# Patient Record
Sex: Male | Born: 1985
Health system: Southern US, Community
[De-identification: ages and names within clinical notes are randomized; demographics above are authoritative.]

## PROBLEM LIST (undated history)

## (undated) DIAGNOSIS — I10 Essential (primary) hypertension: Secondary | ICD-10-CM

## (undated) DIAGNOSIS — E669 Obesity, unspecified: Secondary | ICD-10-CM

## (undated) DIAGNOSIS — D239 Other benign neoplasm of skin, unspecified: Secondary | ICD-10-CM

## (undated) DIAGNOSIS — J309 Allergic rhinitis, unspecified: Secondary | ICD-10-CM

## (undated) DIAGNOSIS — K219 Gastro-esophageal reflux disease without esophagitis: Secondary | ICD-10-CM

## (undated) DIAGNOSIS — R51 Headache: Secondary | ICD-10-CM

## (undated) DIAGNOSIS — Z800 Family history of malignant neoplasm of digestive organs: Secondary | ICD-10-CM

## (undated) DIAGNOSIS — K589 Irritable bowel syndrome without diarrhea: Secondary | ICD-10-CM

## (undated) DIAGNOSIS — R030 Elevated blood-pressure reading, without diagnosis of hypertension: Secondary | ICD-10-CM

## (undated) DIAGNOSIS — F411 Generalized anxiety disorder: Secondary | ICD-10-CM

## (undated) DIAGNOSIS — E66811 Obesity, class 1: Secondary | ICD-10-CM

## (undated) DIAGNOSIS — R519 Headache, unspecified: Secondary | ICD-10-CM

## (undated) HISTORY — PX: OTHER SURGICAL HISTORY: SHX169

## (undated) HISTORY — DX: Elevated blood-pressure reading, without diagnosis of hypertension: R03.0

## (undated) HISTORY — DX: Other benign neoplasm of skin, unspecified: D23.9

## (undated) HISTORY — DX: Family history of malignant neoplasm of digestive organs: Z80.0

## (undated) HISTORY — DX: Gastro-esophageal reflux disease without esophagitis: K21.9

## (undated) HISTORY — DX: Headache: R51

## (undated) HISTORY — DX: Generalized anxiety disorder: F41.1

## (undated) HISTORY — DX: Obesity, class 1: E66.811

## (undated) HISTORY — DX: Headache, unspecified: R51.9

## (undated) HISTORY — DX: Essential (primary) hypertension: I10

## (undated) HISTORY — DX: Allergic rhinitis, unspecified: J30.9

## (undated) HISTORY — PX: CLAVICLE SURGERY: SHX598

## (undated) HISTORY — DX: Irritable bowel syndrome, unspecified: K58.9

## (undated) HISTORY — DX: Obesity, unspecified: E66.9

---

## 2015-09-30 HISTORY — PX: HAIR TRANSPLANT: SHX1719

## 2015-12-05 ENCOUNTER — Ambulatory Visit (INDEPENDENT_AMBULATORY_CARE_PROVIDER_SITE_OTHER): Payer: Self-pay | Admitting: Family Medicine

## 2015-12-05 ENCOUNTER — Encounter: Payer: Self-pay | Admitting: Family Medicine

## 2015-12-05 VITALS — BP 129/86 | HR 67 | Temp 97.7°F | Resp 16 | Ht 69.0 in | Wt 223.5 lb

## 2015-12-05 DIAGNOSIS — F411 Generalized anxiety disorder: Secondary | ICD-10-CM

## 2015-12-05 DIAGNOSIS — F410 Panic disorder [episodic paroxysmal anxiety] without agoraphobia: Secondary | ICD-10-CM

## 2015-12-05 DIAGNOSIS — R519 Headache, unspecified: Secondary | ICD-10-CM

## 2015-12-05 DIAGNOSIS — R51 Headache: Secondary | ICD-10-CM

## 2015-12-05 MED ORDER — DULOXETINE HCL 30 MG PO CPEP: 30.0000 mg | ORAL_CAPSULE | Freq: Every day | ORAL | Status: DC

## 2015-12-05 NOTE — Progress Notes (Signed)
Pre visit review using our clinic review tool, if applicable. No additional management support is needed unless otherwise documented below in the visit note. 

## 2015-12-05 NOTE — Progress Notes (Signed)
Office Note 12/05/2015  CC:  Chief Complaint  Patient presents with  . Establish Care  . Headache    off and on x 1 year    HPI:  Alec Downs is a 30 y.o. White male who is here to establish care. Patient's most recent primary MD: last one was in [place] 3 yrs ago. Old records were not reviewed prior to or during today's visit.  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  Typical HA: he feels back of neck get very tight and the pain extends up into the occipital area to top of head, throbbing.  Last hours usually.  Not sleeping well b/c he worries about everything and his mind won't shut down.  These HA's occur almost daily.  This is more frequent than when they first started.  Mild feeling of disequilibrium at onset but no vertigo or presyncope/syncope.  No nausea.  +Photophobia. Triggers: talking about finances for his company, payroll issues--work stress in general. Gets 4-5 hours of sleep at night b/c brain not shutting down.    He has always been high strung and a chronic worrier, was on adderall in college.  Switched to vyvanse by another MD and had to stop b/c poor appetite and didn't like the way it made him feel.   Mood: some periods of up and down depending on how his company is doing but no prolonged periods of sad/depressed mood.   Has hx of panic attacks: example; was driving and thinking about what was said in a business meeting and started to feel closed in, get sweaty, breath fast and deep, began to feel light headed.   Has happened twice in last 3-4 mo. His last vacation was 10/2015.  His headaches occurred much less frequently during that week. Takes 2-3 ibuprofen when he gets HA--this helps some. He exercises 45 min 3 days a week.  Past Medical History  Diagnosis Date  . Frequent headaches   . GERD (gastroesophageal reflux disease)   . Elevated blood pressure reading  without diagnosis of hypertension     Past Surgical History  Procedure Laterality Date  . Hair transplant  09/2015    [facility]    Family History  Problem Relation Age of Onset  . Hypertension Mother   . Arthritis Father   . Stroke Maternal Grandmother   . Diabetes Maternal Grandmother   . Stroke Maternal Grandfather   . Stroke Paternal Grandmother   . Aneurysm Paternal Grandmother   . Stroke Paternal Grandfather   . Pancreatic cancer Maternal Uncle     Social History   Social History  . Marital Status: Married    Spouse Name: N/A  . Number of Children: N/A  . Years of Education: N/A   Occupational History  . Not on file.   Social History Main Topics  . Smoking status: Never Smoker   . Smokeless tobacco: Never Used  . Alcohol Use: No  . Drug Use: No  . Sexual Activity: Not on file   Other Topics Concern  . Not on file   Social History Narrative   Married, 1 daughter.   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   No T/A/Ds.    Outpatient Encounter Prescriptions as of 12/05/2015  Medication Sig  . cetirizine (ZYRTEC) 10 MG tablet Take 10 mg by mouth daily.  . DULoxetine (CYMBALTA) 30 MG capsule Take  1 capsule (30 mg total) by mouth daily.  Marland Kitchen esomeprazole (NEXIUM) 20 MG capsule Take 20 mg by mouth daily at 12 noon.   No facility-administered encounter medications on file as of 12/05/2015.    No Known Allergies  ROS Review of Systems  Constitutional: Negative for fever and fatigue.  HENT: Negative for congestion and sore throat.   Eyes: Negative for visual disturbance.  Respiratory: Negative for cough.   Cardiovascular: Negative for chest pain.  Gastrointestinal: Negative for nausea and abdominal pain.  Genitourinary: Negative for dysuria.  Musculoskeletal: Negative for back pain and joint swelling.  Skin: Negative for rash.  Neurological: Positive for headaches. Negative for dizziness, tremors and weakness.  Hematological: Negative for  adenopathy.  Psychiatric/Behavioral: Positive for sleep disturbance and decreased concentration. Negative for dysphoric mood. The patient is nervous/anxious.     PE; Blood pressure 129/86, pulse 67, temperature 97.7 F (36.5 C), temperature source Oral, resp. rate 16, height 5\' 9"  (1.753 m), weight 223 lb 8 oz (101.379 kg), SpO2 96 %. Body mass index is 32.99 kg/(m^2).  Gen: Alert, well appearing.  Patient is oriented to person, place, time, and situation. AFFECT: pleasant, lucid thought and speech.  Anxious and very talkative. [id number]: no injection, icteris, swelling, or exudate.  EOMI, PERRLA. Mouth: lips without lesion/swelling.  Oral mucosa pink and moist. Oropharynx without erythema, exudate, or swelling.  Neck - No masses or thyromegaly or limitation in range of motion.  Back of neck soft tissues mildly tight but nontender. CV: RRR, no m/r/g.   LUNGS: CTA bilat, nonlabored resps, good aeration in all lung fields. ABD: soft, NT/ND EXT: no clubbing, cyanosis, or edema.  Neuro: CN 2-12 intact bilaterally, strength 5/5 in proximal and distal upper extremities and lower extremities bilaterally.  No sensory deficits.  No tremor.  No disdiadochokinesis.  No ataxia.  Upper extremity and lower extremity DTRs symmetric.  No pronator drift.  Pertinent labs:  None today.  ASSESSMENT AND PLAN:   New pt; no old records to obtain.  1) Recurrent occipital HA's; these sound like they may have some migrainous features. These are definitely anxiety/stress related. Plan discussed, and at this time he'll continue to treat HA's with prn ibuprofen and we'll work on treating the underlying cause.  2) GAD with panic: he is definitely very high strung. Decided on trial of duloxetine 30mg  qd.  Therapeutic expectations and side effect profile of medication discussed today.  Patient's questions answered. Continue with good exercise habits. We definitely want to try to institute one change/treatment at a  time with him. In the future, he may indeed still need an abortive med like imitrex and a migraine preventative med. We'll just have to wait and see how things go as we get his anxiety controlled.  An After Visit Summary was printed and given to the patient.  Return in about 4 weeks (around 01/02/2016) for f/u anxiety and HAs (30 min).  Signed:  Crissie Sickles, MD           12/05/2015

## 2015-12-16 ENCOUNTER — Ambulatory Visit: Payer: Self-pay | Admitting: Family Medicine

## 2016-01-03 ENCOUNTER — Ambulatory Visit: Payer: Managed Care, Other (non HMO) | Admitting: Family Medicine

## 2016-01-07 ENCOUNTER — Ambulatory Visit: Payer: Managed Care, Other (non HMO) | Admitting: Family Medicine

## 2016-01-15 ENCOUNTER — Ambulatory Visit (INDEPENDENT_AMBULATORY_CARE_PROVIDER_SITE_OTHER): Payer: Managed Care, Other (non HMO) | Admitting: Family Medicine

## 2016-01-15 ENCOUNTER — Encounter: Payer: Self-pay | Admitting: Family Medicine

## 2016-01-15 VITALS — BP 131/83 | HR 87 | Temp 98.7°F | Resp 16 | Ht 69.0 in | Wt 225.2 lb

## 2016-01-15 DIAGNOSIS — F411 Generalized anxiety disorder: Secondary | ICD-10-CM

## 2016-01-15 DIAGNOSIS — R51 Headache: Secondary | ICD-10-CM

## 2016-01-15 DIAGNOSIS — G4700 Insomnia, unspecified: Secondary | ICD-10-CM

## 2016-01-15 DIAGNOSIS — R519 Headache, unspecified: Secondary | ICD-10-CM

## 2016-01-15 NOTE — Progress Notes (Signed)
Pre visit review using our clinic review tool, if applicable. No additional management support is needed unless otherwise documented below in the visit note. 

## 2016-01-15 NOTE — Progress Notes (Signed)
OFFICE VISIT  01/15/2016   CC:  Chief Complaint  Patient presents with  . Follow-up    Anxiety and HA's     HPI:    Patient is a 30 y.o. Caucasian male who presents for 5 week f/u frequent HA's and anxiety. Started duloxetine 30mg  qd last visit. Feels like it is helping his anxiety/stress.  He started seeing a therapist a week or two ago and is happy with this. Denies any headaches since he started taking the duloxetine.  He is happy with results and wants to stick with current dosing. Sleep is still a problem.  Normally initiates sleep well but wakes up around 1:30 AM many nights and can't go back to sleep for several hours.  Has tried melatonin: helps but has a hangover effect per pt report.  He feels like he'll figure his sleep problem out as he gets better at "de-stressing".  A brief ambien trial helped in the past but then when he stopped it he felt "weird" for a week afterwards. He doesn't want to try a new med at this time.  Past Medical History:  Diagnosis Date  . Elevated blood pressure reading without diagnosis of hypertension   . Frequent headaches    secondary to chronic anxiety---CT scan brain normal in the past per pt report.  Marland Kitchen GERD (gastroesophageal reflux disease)   . Obesity, Class I, BMI 30-34.9     Past Surgical History:  Procedure Laterality Date  . HAIR TRANSPLANT  09/2015   [facility]    Outpatient Medications Prior to Visit  Medication Sig Dispense Refill  . cetirizine (ZYRTEC) 10 MG tablet Take 10 mg by mouth daily.    . DULoxetine (CYMBALTA) 30 MG capsule Take 1 capsule (30 mg total) by mouth daily. 30 capsule 1  . esomeprazole (NEXIUM) 20 MG capsule Take 20 mg by mouth daily at 12 noon.     No facility-administered medications prior to visit.     No Known Allergies  ROS As per HPI  PE: Blood pressure 131/83, pulse 87, temperature 98.7 F (37.1 C), temperature source Oral, resp. rate 16, height 5\' 9"  (1.753 m), weight 225 lb 4 oz (102.2 kg),  SpO2 94 %. Gen: Alert, well appearing.  Patient is oriented to person, place, time, and situation. AFFECT: pleasant, lucid thought and speech. No further exam today.  LABS:  none  IMPRESSION AND PLAN:  1) GAD, with secondary stress/tension HA's: pt has responded well to 30mg  qd dosing of duloxetine. He is also benefiting from counseling.  Continue all current measures.  2) Insomnia, secondary to anxiety.  He'll continue to try sleep hygiene/behavior mod. No med trial at this time.  An After Visit Summary was printed and given to the patient.  FOLLOW UP: Return in about 3 months (around 04/16/2016) for f/u anxiety/headaches.  Signed:  Crissie Sickles, MD           01/15/2016

## 2016-01-31 ENCOUNTER — Other Ambulatory Visit: Payer: Self-pay

## 2016-01-31 DIAGNOSIS — F32A Depression, unspecified: Secondary | ICD-10-CM

## 2016-01-31 DIAGNOSIS — F329 Major depressive disorder, single episode, unspecified: Secondary | ICD-10-CM

## 2016-01-31 MED ORDER — DULOXETINE HCL 30 MG PO CPEP: 30.0000 mg | ORAL_CAPSULE | Freq: Every day | ORAL | 4 refills | Status: DC

## 2016-04-06 ENCOUNTER — Other Ambulatory Visit: Payer: Self-pay

## 2016-04-06 MED ORDER — DULOXETINE HCL 30 MG PO CPEP: 30.0000 mg | ORAL_CAPSULE | Freq: Every day | ORAL | 2 refills | Status: DC

## 2016-07-08 ENCOUNTER — Encounter: Payer: Self-pay | Admitting: Family Medicine

## 2016-07-08 ENCOUNTER — Ambulatory Visit (INDEPENDENT_AMBULATORY_CARE_PROVIDER_SITE_OTHER): Payer: BLUE CROSS/BLUE SHIELD | Admitting: Family Medicine

## 2016-07-08 VITALS — BP 124/75 | HR 71 | Temp 98.3°F | Resp 20 | Wt 237.0 lb

## 2016-07-08 DIAGNOSIS — S4992XA Unspecified injury of left shoulder and upper arm, initial encounter: Secondary | ICD-10-CM | POA: Diagnosis not present

## 2016-07-08 NOTE — Progress Notes (Signed)
Alec Downs , 1985-09-11, 31 y.o., male MRN: [id number] Patient Care Team    Relationship Specialty Notifications Start End  Tammi Sou, MD PCP - General Family Medicine  12/05/15     CC: left shoulder injury Subjective: Pt presents for an acute OV with complaints of left shoulder pain that started after a injury at the gym about 6 weeks ago. He states he was bench pressing when he felt pain in his left shoulder and heard a "pop". He states it hurt (points to top of shoulder near [facility] joint) for about a week. He decided to stop lifting for a few weeks and rest the shoulder. He took NSAIDS, used ICE/HEAT and was using a band to stretch shoulder. Yesterday he decided to try to return to lifting and immediately experienced pain and swelling in the same location. He states pushing mechanism and lifting any weight with extending arm is painful. He is able to lift his arm in all direction. He reports having a dislocated shoulder many years ago in that shoulder.   No Known Allergies Social History  Substance Use Topics  . Smoking status: Never Smoker  . Smokeless tobacco: Never Used  . Alcohol use No   Past Medical History:  Diagnosis Date  . Elevated blood pressure reading without diagnosis of hypertension   . Frequent headaches    secondary to chronic anxiety---CT scan brain normal in the past per pt report.  Marland Kitchen GERD (gastroesophageal reflux disease)   . Obesity, Class I, BMI 30-34.9    Past Surgical History:  Procedure Laterality Date  . HAIR TRANSPLANT  09/2015   [facility]   Family History  Problem Relation Age of Onset  . Hypertension Mother   . Arthritis Father   . Stroke Maternal Grandmother   . Diabetes Maternal Grandmother   . Stroke Maternal Grandfather   . Stroke Paternal Grandmother   . Aneurysm Paternal Grandmother   . Stroke Paternal Grandfather   . Pancreatic cancer Maternal Uncle    Allergies as of 07/08/2016   No Known Allergies     Medication List         Accurate as of 07/08/16  3:10 PM. Always use your most recent med list.          cetirizine 10 MG tablet Commonly known as:  ZYRTEC Take 10 mg by mouth daily.   DULoxetine 30 MG capsule Commonly known as:  CYMBALTA Take 1 capsule (30 mg total) by mouth daily.   esomeprazole 20 MG capsule Commonly known as:  NEXIUM Take 20 mg by mouth daily at 12 noon.   naproxen 500 MG tablet Commonly known as:  NAPROSYN Take 1 tablet by mouth 2 (two) times daily.       No results found for this or any previous visit (from the past 24 hour(s)). No results found.   ROS: Negative, with the exception of above mentioned in HPI   Objective:  BP 124/75 (BP Location: Left Arm, Patient Position: Sitting, Cuff Size: Large)   Pulse 71   Temp 98.3 F (36.8 C)   Resp 20   Wt 237 lb (107.5 kg)   SpO2 99%   BMI 35.00 kg/m  Body mass index is 35 kg/m. Gen: Afebrile. No acute distress. Nontoxic in appearance, well developed, well nourished.  HENT: AT. [place].  MMM Eyes:Pupils Equal Round Reactive to light, Extraocular movements intact,  Conjunctiva without redness, discharge or icterus. MSK: no erythema, bruising, rash, or swelling. Skin intact.  TTP AC joint, otherwise no TTP shoulder. Full ROM. Weakness appreciated in resisted flexion. POSITIVE hawkins, empty can test and lift off test. NV intact distally.  Assessment/Plan: Alec Downs is a 31 y.o. male present for acute OV for  Injury of left shoulder, initial encounter - Injury > 6 weeks.  - discussed referral to ortho/SM for pt. He is agreeable to referral. Discussed injury is possibly an [facility] joint injury or a rotator cuff injury. Ortho will provide in house imagining and treatment/poss injection etc.  - continue avoiding lifting. Rest. ICE. NSAIDS. - Ambulatory referral to Sports Medicine/Ortho   electronically signed by:  Howard Pouch, DO  [place]

## 2016-07-08 NOTE — Patient Instructions (Signed)
Rest, no lifting/bench pressing, NSAIDS, ice is your friend.  I have referred you to ortho/SM so they can offer injection vs [place] vs other image that may be required.    This sounds like either AC joint injury or rotator cuff injury.    Acromioclavicular Separation Acromioclavicular separation is an injury to the small joint at the top of the shoulder (acromioclavicular joint or AC joint). The [facility] joint connects the outer tip of the collarbone (clavicle) to the top of the shoulder blade (acromion). Two strong cords of tissue (acromioclavicular ligament and coracoclavicular ligament) stretch across the [facility] joint to keep it in place. An AC joint separation happens when one or both ligaments stretch or tear, causing the joint to separate. There are six types of separation. The type of separation that you have depends on how much the ligaments are damaged and how far the joint has moved out of place. The less severe types of separation are most common. What are the causes? Common causes of this condition include:  A hard, direct hit (blow) to the top of the shoulder.  Falling on the shoulder.  Falling on an outstretched arm. What increases the risk? This condition is more likely to develop in male athletes under age 8 who participate in sports that involve potential contact, such as:  Football.  Rugby.  Hockey.  Cycling.  Martial arts. What are the signs or symptoms?   The main symptom of this condition is shoulder pain. Pain may be mild or severe, depending on the type of separation. Other signs and symptoms in the shoulder may include:  Swelling.  Limited range of motion, especially when moving the arm across the body.  Pain and tenderness when touching the top of the shoulder.  Pain when putting weight on the shoulder, such as rolling on the shoulder while sleeping.  A visible bump (deformity) over the joint.  A clicking or popping sound when moving the shoulder. How is this  diagnosed? This condition may be diagnosed based on:  Your symptoms.  Your medical history, including your history of recent injuries.  A physical exam to check for deformity and limited range of motion.  Imaging tests, such as:  X-rays.  MRI.  Ultrasound. How is this treated? Treatment for this condition may include:  Resting the shoulder before gradually returning to normal activities.  Icing the shoulder.  NSAIDs to help reduce pain and swelling.  A sling to support your shoulder and keep it from moving.  Physical therapy.  Surgery. This is rare. Surgery may be needed for severe injuries that include breaks (fractures) in a bone, or injuries that do not get better with nonsurgical treatments. Surgery is followed by keeping your joint in place for a period of time (immobilization) and physical therapy. Follow these instructions at home: If you have a sling:  Wear the sling as told by your health care provider. Remove it only as told by your health care provider.  Reposition the sling if your fingers tingle, become numb, or turn cold and blue.  Do not let your sling get wet if it is not waterproof. Ask your health care provider if you can remove the sling for bathing and showering.  Keep the sling clean. Managing pain, stiffness, and swelling  If directed, apply ice to the injured area.  Put ice in a plastic bag.  Place a towel between your skin and the bag.  Leave the ice on for 20 minutes, 2-3 times a day.  Move your fingers often to avoid stiffness and to lessen swelling. Driving  Do not drive or operate heavy machinery while taking prescription pain medicine.  Ask your health care provider when it is safe for you to drive. Activity  Rest and return to your normal activities as told by your health care provider. Ask your health care provider what activities are safe for you.  Do exercises as told by your health care provider. General instructions  Do  not use any tobacco products, such as cigarettes, chewing tobacco, and e-cigarettes. Tobacco can delay healing. If you need help quitting, ask your health care provider.  Take over-the-counter and prescription medicines only as told by your health care provider.  Keep all follow-up visits as told by your health care provider. This is important. How is this prevented?  Make sure to use equipment that fits you.  Wear shoulder padding during contact sports.  Be safe and responsible while being active to avoid falls. Contact a health care provider if:  Your pain and stiffness do not improve after 2 weeks. This information is not intended to replace advice given to you by your health care provider. Make sure you discuss any questions you have with your health care provider. Document Released: 05/18/2005 Document Revised: 01/23/2016 Document Reviewed: 04/20/2015 Elsevier Interactive Patient Education  2017 [employer].

## 2016-07-15 ENCOUNTER — Ambulatory Visit: Payer: Self-pay | Admitting: Family Medicine

## 2016-07-20 DIAGNOSIS — M25512 Pain in left shoulder: Secondary | ICD-10-CM | POA: Diagnosis not present

## 2016-07-23 ENCOUNTER — Encounter: Payer: Self-pay | Admitting: Family Medicine

## 2016-07-31 DIAGNOSIS — J209 Acute bronchitis, unspecified: Secondary | ICD-10-CM | POA: Diagnosis not present

## 2016-07-31 DIAGNOSIS — J0100 Acute maxillary sinusitis, unspecified: Secondary | ICD-10-CM | POA: Diagnosis not present

## 2016-08-01 ENCOUNTER — Encounter (HOSPITAL_BASED_OUTPATIENT_CLINIC_OR_DEPARTMENT_OTHER): Payer: Self-pay | Admitting: Adult Health

## 2016-08-01 ENCOUNTER — Emergency Department (HOSPITAL_BASED_OUTPATIENT_CLINIC_OR_DEPARTMENT_OTHER): Payer: BLUE CROSS/BLUE SHIELD

## 2016-08-01 ENCOUNTER — Emergency Department (HOSPITAL_BASED_OUTPATIENT_CLINIC_OR_DEPARTMENT_OTHER)
Admission: EM | Admit: 2016-08-01 | Discharge: 2016-08-01 | Disposition: A | Discharge status: Home / Self Care | Payer: BLUE CROSS/BLUE SHIELD | Attending: Emergency Medicine | Admitting: Emergency Medicine

## 2016-08-01 DIAGNOSIS — R1312 Dysphagia, oropharyngeal phase: Secondary | ICD-10-CM | POA: Diagnosis not present

## 2016-08-01 DIAGNOSIS — R1313 Dysphagia, pharyngeal phase: Secondary | ICD-10-CM | POA: Diagnosis not present

## 2016-08-01 DIAGNOSIS — R070 Pain in throat: Secondary | ICD-10-CM | POA: Diagnosis not present

## 2016-08-01 DIAGNOSIS — R1310 Dysphagia, unspecified: Secondary | ICD-10-CM | POA: Diagnosis not present

## 2016-08-01 MED ORDER — KETOROLAC TROMETHAMINE 15 MG/ML IJ SOLN
15.0000 mg | Freq: Once | INTRAMUSCULAR | Status: AC
  Administered 2016-08-01: 15 mg via INTRAVENOUS
  Filled 2016-08-01: qty 1

## 2016-08-01 MED ORDER — ONDANSETRON HCL 4 MG/2ML IJ SOLN
4.0000 mg | Freq: Once | INTRAMUSCULAR | Status: AC
  Administered 2016-08-01: 4 mg via INTRAVENOUS
  Filled 2016-08-01: qty 2

## 2016-08-01 MED ORDER — MORPHINE SULFATE (PF) 4 MG/ML IV SOLN
4.0000 mg | Freq: Once | INTRAVENOUS | Status: AC
  Administered 2016-08-01: 4 mg via INTRAVENOUS
  Filled 2016-08-01: qty 1

## 2016-08-01 MED ORDER — MAGIC MOUTHWASH W/LIDOCAINE: 5.0000 mL | Freq: Four times a day (QID) | ORAL | 0 refills | Status: DC | PRN

## 2016-08-01 MED ORDER — IOPAMIDOL (ISOVUE-300) INJECTION 61%
100.0000 mL | Freq: Once | INTRAVENOUS | Status: AC | PRN
  Administered 2016-08-01: 100 mL via INTRAVENOUS

## 2016-08-01 NOTE — ED Notes (Signed)
Dr. Maryan Rued into room.

## 2016-08-01 NOTE — ED Notes (Addendum)
No changes, alert, NAD, calm, no dyspnea, dysphagia or dysphonia noted. "feel a little better". CT report reviewed.

## 2016-08-01 NOTE — ED Triage Notes (Signed)
AT 11 AM pt was eating a pickle and sneezed and at that time he felt a popping sensation and his "adams apple rolled to right side of neck" described as needle sharp pain and pt tasted iron taste and went to UC and received some numbing medication. UCC concerned for a esophageal tear or laceration. He reports the pain has not gone away. Airway intact. Talking in full sentences.

## 2016-08-01 NOTE — ED Provider Notes (Signed)
Alec Downs, Alec Downs, Alec Downs, Alec that this documentation has been prepared under the direction and in the presence of [name], Alec Downs, Alec Downs Chief Downs  Patient presents with  . pain with swallowing   The history is provided by the patient. No language interpreter was used.   HPI Comments:  Alec Downs is a 31 y.o. male who presents to the Emergency Department complaining of acute onset, severe, "10/10" sore throat and pain with swallowing onset today around 1:30 PM. He also reports a raspy voice and neck pain which is worse to the touch. Pt states he was eating a pickle, sneezed while swallowing, felt a popping sensation in his throat and now feels a sharp pain which he states feel like glass. Pt visited Urgent Care today, was given a numbing solution which provided no relief and was advised to visit the Alec. He denies chest pain, SOB, hemoptysis or any other associated symptoms.   Past Medical History:  Diagnosis Date  . Elevated blood pressure reading without diagnosis of hypertension   . Frequent headaches    secondary to chronic anxiety---CT scan brain normal in the past per pt report.  Marland Kitchen GERD (gastroesophageal reflux disease)   . Obesity, Class Alec Downs, BMI 30-34.9     Patient Active Problem List   Diagnosis Date Noted  . Injury of left shoulder 07/08/2016    Past Surgical History:  Procedure Laterality Date  . HAIR TRANSPLANT  09/2015   [facility]       Home Medications    Prior to Admission medications   Medication Sig Start Date End Date Taking? Authorizing Provider  cetirizine (ZYRTEC) 10 MG tablet Take 10 mg by mouth daily.    Historical Provider, Alec  DULoxetine (CYMBALTA) 30 MG capsule Take 1 capsule (30 mg total) by mouth daily. 04/06/16   Tammi Sou, Alec  esomeprazole (NEXIUM) 20 MG capsule Take 20 mg by mouth daily at 12 noon.    Historical Provider, Alec  naproxen (NAPROSYN) 500 MG tablet Take 1 tablet by mouth 2 (two) times daily. 01/11/16   Historical Provider, Alec    Family History Family History  Problem Relation Age of Onset  . Hypertension Mother   . Arthritis Father   . Stroke Maternal Grandmother   . Diabetes Maternal Grandmother   . Stroke Maternal Grandfather   . Stroke Paternal Grandmother   . Aneurysm Paternal Grandmother   . Stroke Paternal Grandfather   . Pancreatic cancer Maternal Uncle     Social History Social History  Substance Use Topics  . Smoking status: Never Smoker  . Smokeless tobacco: Never Used  . Alcohol use No     Allergies   Patient has no known allergies.   Review of Systems Review of Systems  HENT: Positive for sore throat and voice change.   Respiratory: Negative for shortness of breath.   Cardiovascular: Negative for chest pain.  Musculoskeletal: Positive for neck pain.  All other systems reviewed and are negative.  Physical Exam Updated Vital Signs BP 154/94   Pulse 75   Temp 98.9 F (37.2 C) (Oral)   Resp 18   Ht 5\' 9"  (1.753 m)   Wt 230 lb (104.3 kg)   SpO2 99%   BMI 33.97 kg/m  Physical Exam  Constitutional: He is oriented to person, place, and time. He appears well-developed and well-nourished.  Raspy voice  HENT:  Head: Normocephalic and atraumatic.  Mouth/Throat: Uvula is midline, oropharynx is clear and moist and mucous membranes are normal. No oropharyngeal exudate, posterior oropharyngeal edema or posterior oropharyngeal erythema.  Eyes: EOM are normal.  Neck: Normal range of motion.  TTP over the cricoid cartilage and anterior right sided soft tissue of the neck. No palpable crepitus. No notable swelling   Cardiovascular: Normal rate, regular rhythm, normal heart sounds and intact distal pulses.   Pulmonary/Chest: Effort normal and breath sounds  normal. No respiratory distress.  Abdominal: Soft. He exhibits no distension. There is no tenderness.  Musculoskeletal: Normal range of motion.  Neurological: He is alert and oriented to person, place, and time.  Skin: Skin is warm and dry.  Psychiatric: He has a normal mood and affect. Judgment normal.  Nursing note and vitals reviewed.  Alec Treatments / Results  DIAGNOSTIC STUDIES:  Oxygen Saturation is 99% on RA, normal by my interpretation.    COORDINATION OF CARE:  7:48 PM Discussed treatment plan with pt at bedside and pt agreed to plan.  Labs (all labs ordered are listed, but only abnormal results are displayed) Labs Reviewed - No data to display  EKG  EKG Interpretation None       Radiology Ct Soft Tissue Neck W Contrast  Result Date: 08/01/2016 CLINICAL DATA:  Throat pain after choking on pickle this afternoon. EXAM: CT NECK WITH CONTRAST TECHNIQUE: Multidetector CT imaging of the neck was performed using the standard protocol following the bolus administration of intravenous contrast. CONTRAST:  164mL ISOVUE-300 IOPAMIDOL (ISOVUE-300) INJECTION 61% COMPARISON:  None. FINDINGS: Pharynx and larynx: Normal. No mass or swelling. No soft tissue emphysema. No evidence of foreign body. Salivary glands: No inflammation, mass, or stone. Thyroid: Normal. Lymph nodes: None enlarged or abnormal density. Vascular: No pathologic finding. Incidental aberrant right subclavian Limited intracranial: Negative Visualized orbits: Negative Mastoids and visualized paranasal sinuses: Essentially clear Skeleton: No acute or aggressive finding Upper chest: Negative IMPRESSION: Negative exam. No evidence of pharyngeal injury. No soft tissue emphysema. Electronically Signed   By: Monte Fantasia M.D.   On: 08/01/2016 20:53    Procedures Procedures (including critical care time)  Medications Ordered in Alec Medications - No data to display   Initial Impression / Assessment and Plan / Alec Course  Alec Downs  have reviewed the triage vital signs and the nursing notes.  Pertinent labs & imaging results that were available during my care of the patient were reviewed by me and considered in my medical decision making (see chart for details).     Patient was eating today when he sneezed and choked on a pickle he was eating. This happened approximately 7 hours ago and he's continued to have severe pain over his cricoid cartilage. He is able to swallow his secretions but states it is extremely painful. He went to urgent care and they gave him a numbing drink which she states did not improve his pain. He also has a mild hoarse voice but denies any shortness of breath. On exam he has no crepitus but does have tenderness over the cricoid cartilage.  Patient had a CT to rule out esophageal perforation or significant edema. It was negative. Patient feeling better after medications. Tolerating secretions and in no acute distress.  Final Clinical Impressions(s) / Alec Diagnoses   Final diagnoses:  Pharyngeal dysphagia    New  Prescriptions Discharge Medication List as of 08/01/2016  9:20 PM    START taking these medications   Details  magic mouthwash w/lidocaine SOLN Take 5 mLs by mouth 4 (four) times daily as needed for mouth pain., Starting Sat 08/01/2016, Print      Alec Downs personally performed the services described in this documentation, which was scribed in my presence.  The recorded information has been reviewed and considered.     Blanchie Dessert, Alec 08/02/16 [phone]

## 2016-08-01 NOTE — ED Notes (Signed)
Pt states he was eating a 1/2 pickle around 1:30p and sneezed as he was swallowing it. Felt "popping" sensation and now feels like glass. Seen at [facility] and giving med to numb area, but it hasn't touched it. Iron taste in mouth. Sore to touch and with movement. No distress. Nothing unusal visualized in throat.

## 2016-08-01 NOTE — ED Notes (Signed)
No changes, to CT °

## 2016-08-01 NOTE — ED Notes (Addendum)
Alert, NAD, calm, interactive, resps e/u, speaking in clear complete sentences, no dyspnea noted, skin W&D, (denies: sob, nausea, numbness/ tingling, taste of blood, dizziness or visual changes). Family at [facility].

## 2016-08-02 ENCOUNTER — Telehealth (HOSPITAL_BASED_OUTPATIENT_CLINIC_OR_DEPARTMENT_OTHER): Payer: Self-pay | Admitting: *Deleted

## 2016-08-05 ENCOUNTER — Telehealth: Payer: Self-pay | Admitting: *Deleted

## 2016-08-05 NOTE — Telephone Encounter (Signed)
Made 3 attempts (lm 06/26/16, mcm 07/23/16, mc 08/05/16) to contact pt, pt has not responded back, influenza marked as declined.

## 2016-08-11 DIAGNOSIS — M25512 Pain in left shoulder: Secondary | ICD-10-CM | POA: Diagnosis not present

## 2016-08-17 DIAGNOSIS — M19012 Primary osteoarthritis, left shoulder: Secondary | ICD-10-CM | POA: Diagnosis not present

## 2016-08-17 DIAGNOSIS — S43432A Superior glenoid labrum lesion of left shoulder, initial encounter: Secondary | ICD-10-CM | POA: Diagnosis not present

## 2016-09-21 DIAGNOSIS — M19012 Primary osteoarthritis, left shoulder: Secondary | ICD-10-CM | POA: Diagnosis not present

## 2016-09-21 DIAGNOSIS — S43432D Superior glenoid labrum lesion of left shoulder, subsequent encounter: Secondary | ICD-10-CM | POA: Diagnosis not present

## 2016-10-27 DIAGNOSIS — M19012 Primary osteoarthritis, left shoulder: Secondary | ICD-10-CM | POA: Diagnosis not present

## 2016-10-27 DIAGNOSIS — G8918 Other acute postprocedural pain: Secondary | ICD-10-CM | POA: Diagnosis not present

## 2016-10-27 DIAGNOSIS — S43432A Superior glenoid labrum lesion of left shoulder, initial encounter: Secondary | ICD-10-CM | POA: Diagnosis not present

## 2016-10-27 DIAGNOSIS — M24812 Other specific joint derangements of left shoulder, not elsewhere classified: Secondary | ICD-10-CM | POA: Diagnosis not present

## 2016-10-27 DIAGNOSIS — M13812 Other specified arthritis, left shoulder: Secondary | ICD-10-CM | POA: Diagnosis not present

## 2016-10-27 DIAGNOSIS — M7542 Impingement syndrome of left shoulder: Secondary | ICD-10-CM | POA: Diagnosis not present

## 2016-11-04 DIAGNOSIS — S43432D Superior glenoid labrum lesion of left shoulder, subsequent encounter: Secondary | ICD-10-CM | POA: Diagnosis not present

## 2016-11-04 DIAGNOSIS — M25612 Stiffness of left shoulder, not elsewhere classified: Secondary | ICD-10-CM | POA: Diagnosis not present

## 2016-11-06 DIAGNOSIS — M25612 Stiffness of left shoulder, not elsewhere classified: Secondary | ICD-10-CM | POA: Diagnosis not present

## 2016-11-16 DIAGNOSIS — M25612 Stiffness of left shoulder, not elsewhere classified: Secondary | ICD-10-CM | POA: Diagnosis not present

## 2016-12-07 DIAGNOSIS — M25612 Stiffness of left shoulder, not elsewhere classified: Secondary | ICD-10-CM | POA: Diagnosis not present

## 2017-01-21 ENCOUNTER — Encounter: Payer: Self-pay | Admitting: Family Medicine

## 2017-01-21 ENCOUNTER — Ambulatory Visit (INDEPENDENT_AMBULATORY_CARE_PROVIDER_SITE_OTHER): Payer: BLUE CROSS/BLUE SHIELD | Admitting: Family Medicine

## 2017-01-21 VITALS — BP 144/77 | HR 69 | Temp 98.0°F | Resp 16 | Ht 69.0 in | Wt 223.8 lb

## 2017-01-21 DIAGNOSIS — G43909 Migraine, unspecified, not intractable, without status migrainosus: Secondary | ICD-10-CM | POA: Diagnosis not present

## 2017-01-21 DIAGNOSIS — Z87898 Personal history of other specified conditions: Secondary | ICD-10-CM | POA: Diagnosis not present

## 2017-01-21 MED ORDER — SCOPOLAMINE 1 MG/3DAYS TD PT72: 1.0000 | MEDICATED_PATCH | TRANSDERMAL | 1 refills | Status: DC

## 2017-01-21 MED ORDER — RIZATRIPTAN BENZOATE 10 MG PO TABS: 10.0000 mg | ORAL_TABLET | ORAL | 1 refills | Status: DC | PRN

## 2017-01-21 NOTE — Progress Notes (Signed)
OFFICE VISIT  01/21/2017   CC:  Chief Complaint  Patient presents with  . Motion Sickness    wants Rx, going on a cruise  . Headache   HPI:    Patient is a 31 y.o. Caucasian male who presents for headaches. Last 2-3 weeks getting daily throbbing headaches in middle of forehead, gets nausea with these (has vomited x 1 every other day or so and the nausea dissipates with this).  Sometimes sees black dots in visual field when he starts feeling the HA.  No asymmetry to the HA pain, no eyelid droop, no excessive eye tearing, no neck pain.  No focal weakness, no paresthesias.  Usually mid afternoon is when it starts, takes excedrin and it goes away slowly until time for bed.  Sleeping well. During this time has been dieting a lot, has cut a lot of carbs out of diet, working out a lot.  Initially felt hungry a lot in first week of this TLC regimen but this is no longer an issue. Hx of stress/tension HAs in the past but these got better with de-stressing. He is going on a [place] cruise x 10d and has hx of horrible motion sickness, asks for scopolamine patch to help with this.  No unusual stressors lately.  Mood has been good.  No panic attacks. FH: paternal GM and father with migraine HAs.  No hearing c/o, no tinnitus, no dizziness or vertigo.  No HA's wake him up in the middle of the night.  Past Medical History:  Diagnosis Date  . Elevated blood pressure reading without diagnosis of hypertension   . Frequent headaches    secondary to chronic anxiety---CT scan brain normal in the past per pt report.  Marland Kitchen GERD (gastroesophageal reflux disease)   . Obesity, Class I, BMI 30-34.9     Past Surgical History:  Procedure Laterality Date  . HAIR TRANSPLANT  09/2015   [facility]    Outpatient Medications Prior to Visit  Medication Sig Dispense Refill  . cetirizine (ZYRTEC) 10 MG tablet Take 10 mg by mouth daily.    . DULoxetine (CYMBALTA) 30 MG capsule Take 1 capsule (30 mg total) by mouth  daily. 90 capsule 2  . esomeprazole (NEXIUM) 20 MG capsule Take 20 mg by mouth daily at 12 noon.    . magic mouthwash w/lidocaine SOLN Take 5 mLs by mouth 4 (four) times daily as needed for mouth pain. (Patient not taking: Reported on 01/21/2017) 100 mL 0  . naproxen (NAPROSYN) 500 MG tablet Take 1 tablet by mouth 2 (two) times daily.     No facility-administered medications prior to visit.     No Known Allergies  ROS As per HPI  PE: Blood pressure (!) 144/77, pulse 69, temperature 98 F (36.7 C), temperature source Oral, resp. rate 16, height 5\' 9"  (1.753 m), weight 223 lb 12 oz (101.5 kg), SpO2 95 %. Gen: Alert, well appearing.  Patient is oriented to person, place, time, and situation. AFFECT: pleasant, lucid thought and speech. WFU:XNAT: no injection, icteris, swelling, or exudate.  EOMI, PERRLA. Mouth: lips without lesion/swelling.  Oral mucosa pink and moist. Oropharynx without erythema, exudate, or swelling.  Neck - No masses or thyromegaly or limitation in range of motion CV: RRR, no m/r/g.   LUNGS: CTA bilat, nonlabored resps, good aeration in all lung fields. EXT: no clubbing, cyanosis, or edema.  Neuro: CN 2-12 intact bilaterally, strength 5/5 in proximal and distal upper extremities and lower extremities bilaterally.  No sensory  deficits.  No tremor.  FNF normal bilat.  No ataxia.  Upper extremity and lower extremity DTRs symmetric.  No pronator drift.  LABS:  none  IMPRESSION AND PLAN:  1) Migraine HA syndrome:  Monitor pattern to see if any triggers can be identified and avoided. Trial of maxalt 10mg  as abortive med.  Therapeutic expectations and side effect profile of medication discussed today.  Patient's questions answered. If this works but [place] still frequent, then we'll discuss trial of prophylactic migraine med: Mag Ox 500 mg qd + Vit B2 100 mg qd would be my first choice for this.  2) Motion sickness: hx of severe. Scopolamine patches rx'd today. Therapeutic  expectations and side effect profile of medication discussed today.  Patient's questions answered.  An After Visit Summary was printed and given to the patient.  FOLLOW UP: Return in about 4 weeks (around 02/18/2017) for f/u migraines.  Signed:  Crissie Sickles, MD           01/21/2017

## 2017-01-24 ENCOUNTER — Other Ambulatory Visit: Payer: Self-pay | Admitting: Family Medicine

## 2017-01-25 NOTE — Telephone Encounter (Signed)
[facility] 

## 2017-02-18 DIAGNOSIS — R51 Headache: Secondary | ICD-10-CM | POA: Diagnosis not present

## 2017-02-18 DIAGNOSIS — Z87898 Personal history of other specified conditions: Secondary | ICD-10-CM | POA: Diagnosis not present

## 2017-02-18 DIAGNOSIS — R1114 Bilious vomiting: Secondary | ICD-10-CM | POA: Diagnosis not present

## 2017-05-23 DIAGNOSIS — R0981 Nasal congestion: Secondary | ICD-10-CM | POA: Diagnosis not present

## 2017-05-23 DIAGNOSIS — R6889 Other general symptoms and signs: Secondary | ICD-10-CM | POA: Diagnosis not present

## 2017-06-04 DIAGNOSIS — J209 Acute bronchitis, unspecified: Secondary | ICD-10-CM | POA: Diagnosis not present

## 2017-06-04 DIAGNOSIS — J019 Acute sinusitis, unspecified: Secondary | ICD-10-CM | POA: Diagnosis not present

## 2017-06-04 DIAGNOSIS — B9689 Other specified bacterial agents as the cause of diseases classified elsewhere: Secondary | ICD-10-CM | POA: Diagnosis not present

## 2017-07-19 DIAGNOSIS — H6991 Unspecified Eustachian tube disorder, right ear: Secondary | ICD-10-CM | POA: Diagnosis not present

## 2017-08-30 DIAGNOSIS — F4001 Agoraphobia with panic disorder: Secondary | ICD-10-CM | POA: Insufficient documentation

## 2017-09-21 ENCOUNTER — Encounter: Payer: Self-pay | Admitting: Family Medicine

## 2017-09-21 ENCOUNTER — Ambulatory Visit (INDEPENDENT_AMBULATORY_CARE_PROVIDER_SITE_OTHER): Payer: BLUE CROSS/BLUE SHIELD | Admitting: Family Medicine

## 2017-09-21 VITALS — BP 121/78 | HR 59 | Temp 98.3°F | Resp 16 | Ht 69.0 in | Wt 232.2 lb

## 2017-09-21 DIAGNOSIS — F410 Panic disorder [episodic paroxysmal anxiety] without agoraphobia: Secondary | ICD-10-CM | POA: Diagnosis not present

## 2017-09-21 DIAGNOSIS — R030 Elevated blood-pressure reading, without diagnosis of hypertension: Secondary | ICD-10-CM

## 2017-09-21 DIAGNOSIS — F411 Generalized anxiety disorder: Secondary | ICD-10-CM | POA: Diagnosis not present

## 2017-09-21 DIAGNOSIS — T753XXD Motion sickness, subsequent encounter: Secondary | ICD-10-CM

## 2017-09-21 MED ORDER — DULOXETINE HCL 60 MG PO CPEP: 60.0000 mg | ORAL_CAPSULE | Freq: Every day | ORAL | 1 refills | Status: DC

## 2017-09-21 MED ORDER — SCOPOLAMINE 1 MG/3DAYS TD PT72: 1.0000 | MEDICATED_PATCH | TRANSDERMAL | 1 refills | Status: DC

## 2017-09-21 NOTE — Progress Notes (Signed)
OFFICE VISIT  09/21/2017   CC:  Chief Complaint  Patient presents with  . Follow-up    Anxiety   HPI:    Patient is a 32 y.o.  male who presents for f/u GAD. Work stress increasing, has another child on the way. Gets nervous, anxious, irritable, keyed up, can't stop worrying. Gets intense periods of anxiety at times (new for him)---chest tightness, breaks out in sweats, feels overwhelmed, slightly dizzy--peaks in 5 min, lasts 20-30 min until back to baseline.  Afraid another may happen. Takes 30mg  duloxetine qd--no side effects.  Getting bp 170s/110 when he is in the midst of panic attack. BP with wrist cuff usually 130s/90 is the highest. He is stress eating--wt is up.  Also, he is going on a cruise for vacation and has hx of significant sea-sickness, says scopolamine patch has helped in the past, asks for rx for this again.  ROS: +stress HAs, not sleeping well.  No CP.  No palpitations.  Past Medical History:  Diagnosis Date  . Elevated blood pressure reading without diagnosis of hypertension   . Frequent headaches    secondary to chronic anxiety---CT scan brain normal in the past per pt report.  Marland Kitchen GAD (generalized anxiety disorder)   . GERD (gastroesophageal reflux disease)   . Obesity, Class I, BMI 30-34.9     Past Surgical History:  Procedure Laterality Date  . HAIR TRANSPLANT  09/2015   [facility]    Outpatient Medications Prior to Visit  Medication Sig Dispense Refill  . cetirizine (ZYRTEC) 10 MG tablet Take 10 mg by mouth daily.    Marland Kitchen esomeprazole (NEXIUM) 20 MG capsule Take 20 mg by mouth daily at 12 noon.    . rizatriptan (MAXALT) 10 MG tablet Take 1 tablet (10 mg total) by mouth as needed for migraine. May repeat in 2 hours if needed 9 tablet 1  . DULoxetine (CYMBALTA) 30 MG capsule TAKE 1 CAPSULE (30 MG TOTAL) BY MOUTH DAILY. 90 capsule 0  . scopolamine (TRANSDERM-SCOP) 1 MG/3DAYS Place 1 patch (1.5 mg total) onto the skin every 3 (three) days. (Patient not  taking: Reported on 09/21/2017) 4 patch 1   No facility-administered medications prior to visit.     No Known Allergies  ROS As per HPI  PE: Blood pressure 121/78, pulse (!) 59, temperature 98.3 F (36.8 C), temperature source Oral, resp. rate 16, height 5\' 9"  (1.753 m), weight 232 lb 4 oz (105.3 kg), SpO2 96 %. Body mass index is 34.3 kg/m.  Gen: alert, well appearing. AFFECT: pleasant but nervous, talking a lot, lucid thought and speech. Wt Readings from Last 2 Encounters:  09/21/17 232 lb 4 oz (105.3 kg)  01/21/17 223 lb 12 oz (101.5 kg)   HEENT: PERRLA, EOMI.   Neck: no LAD, mass, or thyromegaly. CV: RRR, no m/r/g LUNGS: CTA bilat, nonlabored. NEURO: no tremor or tics noted on observation.  Coordination intact. CN 2-12 grossly intact bilaterally, strength 5/5 in all extremeties.  No ataxia.    LABS:    Chemistry   No results found for: NA, K, CL, CO2, BUN, CREATININE, GLU No results found for: CALCIUM, ALKPHOS, AST, ALT, BILITOT     IMPRESSION AND PLAN:  1) GAD with panic: increase duloxetine to 60 mg qd. Increase exercise.  Vacation soon will hopefully help some.  2) Elev bp w/out dx HTN: we'll see if bp better when anxiety better controlled. If not, we'll discuss starting bp med.  3) motion sickness--scopolamine patch for upcoming  cruise to [place].  An After Visit Summary was printed and given to the patient.  FOLLOW UP: Return for 4-6 wks for CPE + f/u anxiety.  Signed:  Crissie Sickles, MD           09/21/2017

## 2017-11-04 DIAGNOSIS — M5414 Radiculopathy, thoracic region: Secondary | ICD-10-CM | POA: Diagnosis not present

## 2017-11-04 DIAGNOSIS — M9903 Segmental and somatic dysfunction of lumbar region: Secondary | ICD-10-CM | POA: Diagnosis not present

## 2017-11-04 DIAGNOSIS — M9904 Segmental and somatic dysfunction of sacral region: Secondary | ICD-10-CM | POA: Diagnosis not present

## 2017-11-04 DIAGNOSIS — M5386 Other specified dorsopathies, lumbar region: Secondary | ICD-10-CM | POA: Diagnosis not present

## 2017-11-05 DIAGNOSIS — M9904 Segmental and somatic dysfunction of sacral region: Secondary | ICD-10-CM | POA: Diagnosis not present

## 2017-11-05 DIAGNOSIS — M5414 Radiculopathy, thoracic region: Secondary | ICD-10-CM | POA: Diagnosis not present

## 2017-11-05 DIAGNOSIS — M5386 Other specified dorsopathies, lumbar region: Secondary | ICD-10-CM | POA: Diagnosis not present

## 2017-11-05 DIAGNOSIS — M9903 Segmental and somatic dysfunction of lumbar region: Secondary | ICD-10-CM | POA: Diagnosis not present

## 2017-11-18 DIAGNOSIS — J0100 Acute maxillary sinusitis, unspecified: Secondary | ICD-10-CM | POA: Diagnosis not present

## 2017-11-18 DIAGNOSIS — R509 Fever, unspecified: Secondary | ICD-10-CM | POA: Diagnosis not present

## 2017-11-18 DIAGNOSIS — J4520 Mild intermittent asthma, uncomplicated: Secondary | ICD-10-CM | POA: Insufficient documentation

## 2017-11-18 DIAGNOSIS — J0110 Acute frontal sinusitis, unspecified: Secondary | ICD-10-CM | POA: Diagnosis not present

## 2017-11-18 DIAGNOSIS — R05 Cough: Secondary | ICD-10-CM | POA: Diagnosis not present

## 2017-11-24 ENCOUNTER — Encounter (INDEPENDENT_AMBULATORY_CARE_PROVIDER_SITE_OTHER): Payer: Self-pay

## 2017-11-25 DIAGNOSIS — M5386 Other specified dorsopathies, lumbar region: Secondary | ICD-10-CM | POA: Diagnosis not present

## 2017-11-25 DIAGNOSIS — M9904 Segmental and somatic dysfunction of sacral region: Secondary | ICD-10-CM | POA: Diagnosis not present

## 2017-11-25 DIAGNOSIS — M9903 Segmental and somatic dysfunction of lumbar region: Secondary | ICD-10-CM | POA: Diagnosis not present

## 2017-11-26 ENCOUNTER — Ambulatory Visit (INDEPENDENT_AMBULATORY_CARE_PROVIDER_SITE_OTHER): Payer: BLUE CROSS/BLUE SHIELD | Admitting: Family Medicine

## 2017-11-26 ENCOUNTER — Encounter: Payer: Self-pay | Admitting: Family Medicine

## 2017-11-26 VITALS — BP 129/80 | HR 47 | Temp 97.5°F | Resp 16 | Ht 69.0 in | Wt 228.4 lb

## 2017-11-26 DIAGNOSIS — F411 Generalized anxiety disorder: Secondary | ICD-10-CM

## 2017-11-26 DIAGNOSIS — T753XXA Motion sickness, initial encounter: Secondary | ICD-10-CM | POA: Diagnosis not present

## 2017-11-26 DIAGNOSIS — T50905A Adverse effect of unspecified drugs, medicaments and biological substances, initial encounter: Secondary | ICD-10-CM | POA: Diagnosis not present

## 2017-11-26 MED ORDER — SCOPOLAMINE 1 MG/3DAYS TD PT72: 1.0000 | MEDICATED_PATCH | TRANSDERMAL | 1 refills | Status: DC

## 2017-11-26 MED ORDER — DULOXETINE HCL 20 MG PO CPEP: ORAL_CAPSULE | ORAL | 3 refills | Status: DC

## 2017-11-26 NOTE — Progress Notes (Signed)
OFFICE VISIT  11/26/2017   CC:  Chief Complaint  Patient presents with  . Follow-up    RCI, pt is fasting.     HPI:    Patient is a 32 y.o.  male who presents for f/u GAD. He also requests scopolamine patch for motion sickness--going deep sea fishing very soon.  Says he feels "overly calm" since going up on cymbalta, "almost like I'm stoned".  Feels socially awkward from this. Mild cognitive dysfunction/overmedicated.  Asks about a middle ground regarding dosing.  He has had 2 months to acclimate to this dose and these effects have not changed. Still lots of things on his mind when trying to sleep but sleeping better on higher dose of duloxetine. He is still feeling like the med is working well for him regarding anxiety.  No panic and not feeling nearly as overwhelmed.  Exercising more, dieting, losing some wt.  Past Medical History:  Diagnosis Date  . Elevated blood pressure reading without diagnosis of hypertension   . Frequent headaches    secondary to chronic anxiety---CT scan brain normal in the past per pt report.  Marland Kitchen GAD (generalized anxiety disorder)   . GERD (gastroesophageal reflux disease)   . Obesity, Class I, BMI 30-34.9     Past Surgical History:  Procedure Laterality Date  . HAIR TRANSPLANT  09/2015   [facility]    Outpatient Medications Prior to Visit  Medication Sig Dispense Refill  . cetirizine (ZYRTEC) 10 MG tablet Take 10 mg by mouth daily.    Marland Kitchen esomeprazole (NEXIUM) 20 MG capsule Take 20 mg by mouth daily at 12 noon.    . rizatriptan (MAXALT) 10 MG tablet Take 1 tablet (10 mg total) by mouth as needed for migraine. May repeat in 2 hours if needed 9 tablet 1  . DULoxetine (CYMBALTA) 60 MG capsule Take 1 capsule (60 mg total) by mouth daily. 30 capsule 1  . scopolamine (TRANSDERM-SCOP) 1 MG/3DAYS Place 1 patch (1.5 mg total) onto the skin every 3 (three) days. (Patient not taking: Reported on 11/26/2017) 4 patch 1   No facility-administered medications prior  to visit.     No Known Allergies  ROS As per HPI  PE: Blood pressure 129/80, pulse (!) 47, temperature (!) 97.5 F (36.4 C), temperature source Oral, resp. rate 16, height 5\' 9"  (1.753 m), weight 228 lb 6 oz (103.6 kg), SpO2 98 %. Body mass index is 33.73 kg/m.  Wt Readings from Last 2 Encounters:  11/26/17 228 lb 6 oz (103.6 kg)  09/21/17 232 lb 4 oz (105.3 kg)    Gen: alert, oriented x 4, affect pleasant.  Lucid thinking and conversation noted. HEENT: PERRLA, EOMI.   Neck: no LAD, mass, or thyromegaly. CV: RR, bradycardia, no m/r/g LUNGS: CTA bilat, nonlabored. NEURO: no tremor or tics noted on observation.  Coordination intact. CN 2-12 grossly intact bilaterally, strength 5/5 in all extremeties.  No ataxia. Patellar DTRs brisk (3+), 1+ achilles bilat, no clonus.  LABS:    Chemistry   No results found for: NA, K, CL, CO2, BUN, CREATININE, GLU No results found for: CALCIUM, ALKPHOS, AST, ALT, BILITOT    IMPRESSION AND PLAN:  1) GAD, improved but felt overmedicated on 60mg  qd for the last 2 mo. Will change to 40mg  (20mg  tabs, 2 qd).  2) Motion sickness: scopolamine patches RF'd.  An After Visit Summary was printed and given to the patient.  FOLLOW UP: Return in about 2 months (around 01/26/2018) for f/u anxiety.  Signed:  Crissie Sickles, MD           11/26/2017

## 2018-03-15 ENCOUNTER — Ambulatory Visit (INDEPENDENT_AMBULATORY_CARE_PROVIDER_SITE_OTHER): Payer: BLUE CROSS/BLUE SHIELD | Admitting: Family Medicine

## 2018-03-15 ENCOUNTER — Encounter: Payer: Self-pay | Admitting: Family Medicine

## 2018-03-15 VITALS — BP 134/74 | HR 51 | Temp 98.2°F | Resp 16 | Ht 69.0 in | Wt 233.4 lb

## 2018-03-15 DIAGNOSIS — Z0000 Encounter for general adult medical examination without abnormal findings: Secondary | ICD-10-CM

## 2018-03-15 DIAGNOSIS — Z23 Encounter for immunization: Secondary | ICD-10-CM | POA: Diagnosis not present

## 2018-03-15 DIAGNOSIS — F411 Generalized anxiety disorder: Secondary | ICD-10-CM

## 2018-03-15 DIAGNOSIS — F410 Panic disorder [episodic paroxysmal anxiety] without agoraphobia: Secondary | ICD-10-CM

## 2018-03-15 LAB — LIPID PANEL
Cholesterol: 149 mg/dL (ref 0–200)
HDL: 42 mg/dL (ref >39.00)
LDL Cholesterol: 78 mg/dL (ref 0–99)
NonHDL: 107.21
Total CHOL/HDL Ratio: 4
Triglycerides: 148 mg/dL (ref 0.0–149.0)
VLDL: 29.6 mg/dL (ref 0.0–40.0)

## 2018-03-15 LAB — CBC WITH DIFFERENTIAL/PLATELET
Basophils Absolute: 0 10*3/uL (ref 0.0–0.1)
Basophils Relative: 0.4 % (ref 0.0–3.0)
Eosinophils Absolute: 0.1 10*3/uL (ref 0.0–0.7)
Eosinophils Relative: 1.6 % (ref 0.0–5.0)
HCT: 48.2 % (ref 39.0–52.0)
Hemoglobin: 16.3 g/dL (ref 13.0–17.0)
Lymphocytes Relative: 32.4 % (ref 12.0–46.0)
Lymphs Abs: 1.8 10*3/uL (ref 0.7–4.0)
MCHC: 33.8 g/dL (ref 30.0–36.0)
MCV: 89 fl (ref 78.0–100.0)
Monocytes Absolute: 0.6 10*3/uL (ref 0.1–1.0)
Monocytes Relative: 11 % (ref 3.0–12.0)
Neutro Abs: 3.1 10*3/uL (ref 1.4–7.7)
Neutrophils Relative %: 54.6 % (ref 43.0–77.0)
Platelets: 228 10*3/uL (ref 150.0–400.0)
RBC: 5.41 Mil/uL (ref 4.22–5.81)
RDW: 12.9 % (ref 11.5–15.5)
WBC: 5.7 10*3/uL (ref 4.0–10.5)

## 2018-03-15 LAB — COMPREHENSIVE METABOLIC PANEL
ALT: 37 U/L (ref 0–53)
AST: 21 U/L (ref 0–37)
Albumin: 4.9 g/dL (ref 3.5–5.2)
Alkaline Phosphatase: 45 U/L (ref 39–117)
BUN: 16 mg/dL (ref 6–23)
CO2: 33 mEq/L — ABNORMAL HIGH (ref 19–32)
Calcium: 10 mg/dL (ref 8.4–10.5)
Chloride: 101 mEq/L (ref 96–112)
Creatinine, Ser: 0.97 mg/dL (ref 0.40–1.50)
GFR: 94.77 mL/min (ref >60.00)
Glucose, Bld: 97 mg/dL (ref 70–99)
Potassium: 4.4 mEq/L (ref 3.5–5.1)
Sodium: 140 mEq/L (ref 135–145)
Total Bilirubin: 0.6 mg/dL (ref 0.2–1.2)
Total Protein: 7.4 g/dL (ref 6.0–8.3)

## 2018-03-15 LAB — TSH: TSH: 2.2 u[IU]/mL (ref 0.35–4.50)

## 2018-03-15 MED ORDER — LORAZEPAM 0.5 MG PO TABS: ORAL_TABLET | ORAL | 0 refills | Status: DC

## 2018-03-15 NOTE — Addendum Note (Signed)
Addended by: Gordy Councilman on: 03/15/2018 11:18 AM   Modules accepted: Orders

## 2018-03-15 NOTE — Progress Notes (Signed)
Office Note 03/15/2018  CC:  Chief Complaint  Patient presents with  . Annual Exam    Pt is fasting.     HPI:  Alec Downs is a 32 y.o. male who is here for annual health maintenance exam.  Of note, he took himself off the cymbalta b/c of continued effect of making him feel "weird" or "awkward". It had initially helped some with his anxiety, then he says it actually made his anxiety/worry worse. He is high strung, tends to worry about everything, almost "never shuts off".  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 Focus/concentration is poor.  Denies depression.  Low dose vyvanse in the past caused OVER focus and it affected his sleep. He is having stress HA's as usual.  His 2nd daughter was born 2 wks ago. Busy running his own [employer].  BP sometimes 170-180/90s when he is really anxious/panicky. He feels like he's managing things ok at this time.  Working out: lifting wt's.   No vision c/o. Diet: working on lower fat/lower carb choices.    Past Medical History:  Diagnosis Date  . Allergic rhinitis   . Elevated blood pressure reading without diagnosis of hypertension   . Family history of colon cancer    Uncle with col cancer, and father with adenomatous polyps in his 48s.  . Frequent headaches    secondary to chronic anxiety---CT scan brain normal in the past per pt report.  Marland Kitchen GAD (generalized anxiety disorder)   . GERD (gastroesophageal reflux disease)   . Obesity, Class I, BMI 30-34.9     Past Surgical History:  Procedure Laterality Date  . HAIR TRANSPLANT  09/2015   [facility]    Family History  Problem Relation Age of Onset  . Hypertension Mother   . Arthritis Father   . Stroke Maternal Grandmother   . Diabetes Maternal Grandmother   . Stroke Maternal Grandfather   . Stroke Paternal Grandmother   . Aneurysm Paternal Grandmother   . Stroke Paternal Grandfather   . Pancreatic cancer  Maternal Uncle     Social History   Socioeconomic History  . Marital status: Married    Spouse name: Not on file  . Number of children: Not on file  . Years of education: Not on file  . Highest education level: Not on file  Occupational History  . Not on file  Social Needs  . Financial resource strain: Not on file  . Food insecurity:    Worry: Not on file    Inability: Not on file  . Transportation needs:    Medical: Not on file    Non-medical: Not on file  Tobacco Use  . Smoking status: Never Smoker  . Smokeless tobacco: Never Used  Substance and Sexual Activity  . Alcohol use: No  . Drug use: No  . Sexual activity: Not on file  Lifestyle  . Physical activity:    Days per week: Not on file    Minutes per session: Not on file  . Stress: Not on file  Relationships  . Social connections:    Talks on phone: Not on file    Gets together: Not on file    Attends religious service: Not on file    Active member of club or organization: Not on file    Attends meetings of clubs or organizations: Not on file    Relationship status: Not on file  . Intimate partner violence:  Fear of current or ex partner: Not on file    Emotionally abused: Not on file    Physically abused: Not on file    Forced sexual activity: Not on file  Other Topics Concern  . Not on file  Social History Narrative   Married, 1 daughter.   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   No T/A/Ds.    Outpatient Medications Prior to Visit  Medication Sig Dispense Refill  . cetirizine (ZYRTEC) 10 MG tablet Take 10 mg by mouth daily.    Marland Kitchen esomeprazole (NEXIUM) 20 MG capsule Take 20 mg by mouth daily at 12 noon.    . rizatriptan (MAXALT) 10 MG tablet Take 1 tablet (10 mg total) by mouth as needed for migraine. May repeat in 2 hours if needed 9 tablet 1  . DULoxetine (CYMBALTA) 20 MG capsule 2 caps po qd (Patient not taking: Reported on 03/15/2018) 60 capsule 3  . scopolamine  (TRANSDERM-SCOP) 1 MG/3DAYS Place 1 patch (1.5 mg total) onto the skin every 3 (three) days. (Patient not taking: Reported on 03/15/2018) 4 patch 1   No facility-administered medications prior to visit.     Allergies  Allergen Reactions  . Duloxetine Other (See Comments)    Shaky, paranoid      ROS Review of Systems  Constitutional: Negative for appetite change, chills, fatigue and fever.  HENT: Negative for congestion, dental problem, ear pain and sore throat.   Eyes: Negative for discharge, redness and visual disturbance.  Respiratory: Negative for cough, chest tightness, shortness of breath and wheezing.   Cardiovascular: Negative for chest pain, palpitations and leg swelling.  Gastrointestinal: Negative for abdominal pain, blood in stool, diarrhea, nausea and vomiting.  Genitourinary: Negative for difficulty urinating, dysuria, flank pain, frequency, hematuria and urgency.  Musculoskeletal: Positive for arthralgias (recent L knee pain). Negative for back pain, joint swelling, myalgias and neck stiffness.  Skin: Negative for pallor and rash.  Neurological: Negative for dizziness, speech difficulty, weakness and headaches.  Hematological: Negative for adenopathy. Does not bruise/bleed easily.  Psychiatric/Behavioral: Positive for decreased concentration and sleep disturbance. Negative for confusion, dysphoric mood, self-injury and suicidal ideas. The patient is nervous/anxious.     PE; Blood pressure 134/74, pulse (!) 51, temperature 98.2 F (36.8 C), temperature source Oral, resp. rate 16, height 5\' 9"  (1.753 m), weight 233 lb 6 oz (105.9 kg), SpO2 96 %. Body mass index is 34.46 kg/m.  Gen: Alert, well appearing.  Patient is oriented to person, place, time, and situation. AFFECT: pleasant, lucid thought and speech. ENT: Ears: EACs clear, normal epithelium.  TMs with good light reflex and landmarks bilaterally.  Eyes: no injection, icteris, swelling, or exudate.  EOMI,  PERRLA. Nose: no drainage or turbinate edema/swelling.  No injection or focal lesion.  Mouth: lips without lesion/swelling.  Oral mucosa pink and moist.  Dentition intact and without obvious caries or gingival swelling.  Oropharynx without erythema, exudate, or swelling.  Neck: supple/nontender.  No LAD, mass, or TM.  Carotid pulses 2+ bilaterally, without bruits. CV: RRR, no m/r/g.   LUNGS: CTA bilat, nonlabored resps, good aeration in all lung fields. ABD: soft, NT, ND, BS normal.  No hepatospenomegaly or mass.  No bruits. EXT: no clubbing, cyanosis, or edema.  Musculoskeletal: no joint swelling, erythema, warmth, or tenderness.  ROM of all joints intact. Skin - no sores or suspicious lesions or rashes or color changes   Pertinent labs:  No results found for: TSH No results found  for: WBC, HGB, HCT, MCV, PLT No results found for: CREATININE, BUN, NA, K, CL, CO2 No results found for: ALT, AST, GGT, ALKPHOS, BILITOT No results found for: CHOL No results found for: HDL No results found for: LDLCALC No results found for: TRIG No results found for: CHOLHDL No results found for: PSA  ASSESSMENT AND PLAN:   Health maintenance exam: Reviewed age and gender appropriate health maintenance issues (prudent diet, regular exercise, health risks of tobacco and excessive alcohol, use of seatbelts, fire alarms in home, use of sunscreen).  Also reviewed age and gender appropriate health screening as well as vaccine recommendations. Vaccines: flu vaccine and Tdap given today. Labs:   Fasting HP labs today.  GAD, sometimes peaks/panic-->Start prn ativan-->ativan 0.5mg , 1-2 bid prn, #60, no RF.  He did not tolerate dulox or a stimulant in the past. He is very high strung and functions well most of the time like this, but I think he needs something on a prn basis only and this would be best for him, esp with sleep. If he is doing well on this med at f/u in 1 mo, will get controlled substance contract in  chart.  An After Visit Summary was printed and given to the patient.  FOLLOW UP:  Return in about 4 weeks (around 04/12/2018) for f/u anxiety.  Signed:  Crissie Sickles, MD           03/15/2018

## 2018-03-15 NOTE — Patient Instructions (Signed)

## 2018-03-17 ENCOUNTER — Encounter: Payer: Self-pay | Admitting: Family Medicine

## 2018-03-17 ENCOUNTER — Ambulatory Visit: Payer: BLUE CROSS/BLUE SHIELD | Admitting: Family Medicine

## 2018-03-17 NOTE — Progress Notes (Deleted)
OFFICE VISIT  03/17/2018   CC: No chief complaint on file.    HPI:    Patient is a 32 y.o. Caucasian male who presents for knee pain.  Past Medical History:  Diagnosis Date  . Allergic rhinitis   . Elevated blood pressure reading without diagnosis of hypertension   . Family history of colon cancer    Uncle with col cancer, and father with adenomatous polyps in his 61s.  . Frequent headaches    secondary to chronic anxiety---CT scan brain normal in the past per pt report.  Marland Kitchen GAD (generalized anxiety disorder)   . GERD (gastroesophageal reflux disease)   . Obesity, Class I, BMI 30-34.9     Past Surgical History:  Procedure Laterality Date  . HAIR TRANSPLANT  09/2015   [facility]    Outpatient Medications Prior to Visit  Medication Sig Dispense Refill  . cetirizine (ZYRTEC) 10 MG tablet Take 10 mg by mouth daily.    Marland Kitchen esomeprazole (NEXIUM) 20 MG capsule Take 20 mg by mouth daily at 12 noon.    Marland Kitchen LORazepam (ATIVAN) 0.5 MG tablet 1-2 tabs po bid prn moderate-to-severe anxiety 60 tablet 0  . rizatriptan (MAXALT) 10 MG tablet Take 1 tablet (10 mg total) by mouth as needed for migraine. May repeat in 2 hours if needed 9 tablet 1   No facility-administered medications prior to visit.     Allergies  Allergen Reactions  . Duloxetine Other (See Comments)    Shaky, paranoid      ROS As per HPI  PE: There were no vitals taken for this visit. ***  LABS:  ***  IMPRESSION AND PLAN:  No problem-specific Assessment & Plan notes found for this encounter.   An After Visit Summary was printed and given to the patient.  FOLLOW UP: No follow-ups on file.  Signed:  Crissie Sickles, MD           03/17/2018

## 2018-03-18 ENCOUNTER — Ambulatory Visit (INDEPENDENT_AMBULATORY_CARE_PROVIDER_SITE_OTHER): Payer: BLUE CROSS/BLUE SHIELD | Admitting: Family Medicine

## 2018-03-18 ENCOUNTER — Encounter: Payer: Self-pay | Admitting: Family Medicine

## 2018-03-18 VITALS — BP 136/75 | HR 60 | Temp 98.2°F | Resp 16 | Ht 69.0 in | Wt 234.1 lb

## 2018-03-18 DIAGNOSIS — G8929 Other chronic pain: Secondary | ICD-10-CM | POA: Diagnosis not present

## 2018-03-18 DIAGNOSIS — M7652 Patellar tendinitis, left knee: Secondary | ICD-10-CM

## 2018-03-18 DIAGNOSIS — M25562 Pain in left knee: Secondary | ICD-10-CM

## 2018-03-18 DIAGNOSIS — S838X2A Sprain of other specified parts of left knee, initial encounter: Secondary | ICD-10-CM

## 2018-03-18 NOTE — Patient Instructions (Signed)
Patellar Tendinitis Rehab Ask your health care provider which exercises are safe for you. Do exercises exactly as told by your health care provider and adjust them as directed. It is normal to feel mild stretching, pulling, tightness, or discomfort as you do these exercises, but you should stop right away if you feel sudden pain or your pain gets worse.Do not begin these exercises until told by your health care provider. Stretching and range of motion exercises This exercise warms up your muscles and joints and improves the movement and flexibility of your knee. This exercise also helps to relieve pain and stiffness. Exercise A: Hamstring, doorway  1. Lie on your back in front of a doorway with your __________ leg resting against the wall and your other leg flat on the floor in the doorway. There should be a slight bend in your __________ knee. 2. Straighten your __________ knee. You should feel a stretch behind your knee or thigh. If you do not, scoot your buttocks closer to the door. 3. Hold this position for __________ seconds. Repeat __________ times. Complete this stretch __________ times a day. Strengthening exercises These exercises build strength and endurance in your knee. Endurance is the ability to use your muscles for a long time, even after they get tired. Exercise B: Quadriceps, isometric  1. Lie on your back with your __________ leg extended and your other knee bent. 2. Slowly tense the muscles in the front of your __________ thigh. When you do this, you should see your kneecap slide up toward your hip or see increased dimpling just above the knee. This motion will push the back of your knee toward the floor. If this is painful, try putting a rolled-up hand towel under your knee to support it in a bent position. Change the size of the towel to find a position that allows you to do this exercise without any pain. 3. For __________ seconds, hold the muscle as tight as you can without  increasing your pain. 4. Relax the muscles slowly and completely. Repeat __________ times. Complete this exercise __________ times a day. Exercise C: Straight leg raises ( quadriceps) 1. Lie on your back with your __________ leg extended and your other knee bent. 2. Tense the muscles in the front of your __________ thigh. When you do this, you should see your kneecap slide up or see increased dimpling just above the knee. 3. Keep these muscles tight as you raise your leg 4-6 inches (10-15 cm) off the floor. Do not let your moving knee bend. 4. Hold this position for __________ seconds. 5. Keep these muscles tense as you slowly lower your leg. 6. Relax your muscles slowly and completely. Repeat __________ times. Complete this exercise __________ times a day. Exercise D: Squats 1. Stand in front of a table, with your feet and knees pointing straight ahead. You may rest your hands on the table for balance but not for support. 2. Slowly bend your knees and lower your hips like you are going to sit in a chair. ? Keep your weight over your heels, not over your toes. ? Keep your lower legs upright so they are parallel with the table legs. ? Do not let your hips go lower than your knees. ? Do not bend lower than told by your health care provider. ? If your knee pain increases, do not bend as low. 3. Hold the squat position for __________ seconds. 4. Slowly push with your legs to return to standing. Do not use  your hands to pull yourself to standing. Repeat __________ times. Complete this exercise __________ times a day. Exercise E: Step-downs 1. Stand on the edge of a step. 2. Keeping your weight over your __________ heel, slowly bend your __________ knee to bring your __________ heel toward the floor. Lower your heel as far as you can while keeping control and without increasing any discomfort. ? Do not let your __________ knee come forward. ? Use your leg muscles, not gravity, to lower your  body. ? Hold a wall or rail for balance if needed. 3. Slowly push through your heel to lift your body weight back up. 4. Return to the starting position. Repeat __________ times. Complete this exercise __________ times a day. Exercise F: Straight leg raises ( hip abductors) 1. Lie on your side with your __________ leg in the top position. Lie so your head, shoulder, knee, and hip line up. You may bend your lower knee to help you keep your balance. 2. Roll your hips slightly forward, so that your hips are stacked directly over each other and your __________ knee is facing forward. 3. Leading with your heel, lift your top leg 4-6 inches (10-15 cm). You should feel the muscles in your outer hip lifting. ? Do not let your foot drift forward. ? Do not let your knee roll toward the ceiling. 4. Hold this position for __________ seconds. 5. Slowly lower your leg to the starting position. 6. Let your muscles relax completely after each repetition. Repeat __________ times. Complete this exercise __________ times a day. This information is not intended to replace advice given to you by your health care provider. Make sure you discuss any questions you have with your health care provider. Document Released: 05/18/2005 Document Revised: 01/23/2016 Document Reviewed: 02/19/2015 Elsevier Interactive Patient Education  [employer].

## 2018-03-18 NOTE — Progress Notes (Signed)
OFFICE VISIT  03/18/2018   CC:  Chief Complaint  Patient presents with  . Knee Pain    Left   HPI:    Patient is a 32 y.o. Caucasian male who presents for left knee pain. Gradual onset over the last 6-12 mo, progressing. Hurts around lateral aspect of left knee and a bit underneath his knee cap.  Doesn't hurt unless he goes up stairs or runs or walks excessively.  Says it has swelled up on occasion--like after he had been running.  Ice helps this.  Rarely gets so stiff that he feels like it is "locked up" after he has been sitting for long period.  It has given way in the past when he was doing body wt lunges. Stabilizing brace no help.  Ibup and tyl mildly helpful.   Lots of soccer all his life, lifting wts last year or so.  Past Medical History:  Diagnosis Date  . Allergic rhinitis   . Elevated blood pressure reading without diagnosis of hypertension   . Family history of colon cancer    Uncle with col cancer, and father with adenomatous polyps in his 64s.  . Frequent headaches    secondary to chronic anxiety---CT scan brain normal in the past per pt report.  Marland Kitchen GAD (generalized anxiety disorder)   . GERD (gastroesophageal reflux disease)   . Obesity, Class I, BMI 30-34.9     Past Surgical History:  Procedure Laterality Date  . HAIR TRANSPLANT  09/2015   [facility]    Outpatient Medications Prior to Visit  Medication Sig Dispense Refill  . cetirizine (ZYRTEC) 10 MG tablet Take 10 mg by mouth daily.    Marland Kitchen esomeprazole (NEXIUM) 20 MG capsule Take 20 mg by mouth daily at 12 noon.    Marland Kitchen LORazepam (ATIVAN) 0.5 MG tablet 1-2 tabs po bid prn moderate-to-severe anxiety 60 tablet 0  . rizatriptan (MAXALT) 10 MG tablet Take 1 tablet (10 mg total) by mouth as needed for migraine. May repeat in 2 hours if needed 9 tablet 1   No facility-administered medications prior to visit.     Allergies  Allergen Reactions  . Duloxetine Other (See Comments)    Shaky, paranoid      ROS As  per HPI  PE: Blood pressure 136/75, pulse 60, temperature 98.2 F (36.8 C), temperature source Oral, resp. rate 16, height 5\' 9"  (1.753 m), weight 234 lb 2 oz (106.2 kg), SpO2 97 %. Gen: Alert, well appearing.  Patient is oriented to person, place, time, and situation. AFFECT: pleasant, lucid thought and speech. Left knee: no swelling, warmth, or erythema. ROM fully intact w/out crepitus.  He has tenderness to palpation of the patellar tendon. Also, he has tenderness to palpation of entire lateral joint line.  Varus/valgus stress only elicits joint line pain when the lateral joint line space is compressed.  McMurray's elicits signif worsening of lateral joint line pain/tenderness. Lachman's normal.  LABS:  Lab Results  Component Value Date   TSH 2.20 03/15/2018   Lab Results  Component Value Date   WBC 5.7 03/15/2018   HGB 16.3 03/15/2018   HCT 48.2 03/15/2018   MCV 89.0 03/15/2018   PLT 228.0 03/15/2018   Lab Results  Component Value Date   CREATININE 0.97 03/15/2018   BUN 16 03/15/2018   NA 140 03/15/2018   K 4.4 03/15/2018   CL 101 03/15/2018   CO2 33 (H) 03/15/2018   Lab Results  Component Value Date   ALT 37  03/15/2018   AST 21 03/15/2018   ALKPHOS 45 03/15/2018   BILITOT 0.6 03/15/2018   Lab Results  Component Value Date   CHOL 149 03/15/2018   Lab Results  Component Value Date   HDL 42.00 03/15/2018   Lab Results  Component Value Date   LDLCALC 78 03/15/2018   Lab Results  Component Value Date   TRIG 148.0 03/15/2018   Lab Results  Component Value Date   CHOLHDL 4 03/15/2018    IMPRESSION AND PLAN:  Chronic L knee pain: suspect he has both patellar tendonitis AND lateral meniscus injury. Much lower suspicion of lateral collateral ligament injury or IT band syndrome. Discussed options with pt today and decided it would be best to get evaluated by sports medicine at this time. Patellar tendon rehab exercises reviewed with pt, handout  given---he'll avoid the exercises that required significant knee bending with wt bearing for now.  An After Visit Summary was printed and given to the patient.  FOLLOW UP: Return for as needed.  Signed:  Crissie Sickles, MD           03/18/2018

## 2018-03-28 ENCOUNTER — Ambulatory Visit (INDEPENDENT_AMBULATORY_CARE_PROVIDER_SITE_OTHER): Payer: BLUE CROSS/BLUE SHIELD

## 2018-03-28 ENCOUNTER — Ambulatory Visit (INDEPENDENT_AMBULATORY_CARE_PROVIDER_SITE_OTHER): Payer: BLUE CROSS/BLUE SHIELD | Admitting: Sports Medicine

## 2018-03-28 ENCOUNTER — Encounter: Payer: Self-pay | Admitting: Sports Medicine

## 2018-03-28 VITALS — BP 130/82 | HR 71 | Ht 69.0 in | Wt 232.4 lb

## 2018-03-28 DIAGNOSIS — G8929 Other chronic pain: Secondary | ICD-10-CM

## 2018-03-28 DIAGNOSIS — M25562 Pain in left knee: Secondary | ICD-10-CM

## 2018-03-28 MED ORDER — DICLOFENAC SODIUM 2 % TD SOLN: 1.0000 "application " | Freq: Two times a day (BID) | TRANSDERMAL | 0 refills | Status: AC

## 2018-03-28 MED ORDER — DICLOFENAC SODIUM 2 % TD SOLN: 1.0000 "application " | Freq: Two times a day (BID) | TRANSDERMAL | 2 refills | Status: DC

## 2018-03-28 NOTE — Patient Instructions (Addendum)
Also check out [employer]" which is a program developed by Dr. Minerva Ends.   There are links to a couple of his YouTube Videos below and I would like to see performing one of his videos 5-6 days per week.    A good intro video is: "Independence from Pain 7-minute Video" - [email or web address]   Exercises that focus more on the neck are as below: Dr. Archie Balboa with [facility] teaching neck and shoulder details Part 1 - [email or web address] Part 2 Dr. Archie Balboa with [facility] quick routine to practice daily - https://[email or web address].[email or web address]  Do not try to attempt the entire video when first beginning.    Try breaking of each exercise that he goes into shorter segments.  Otherwise if they perform an exercise for 45 seconds, start with 15 seconds and rest and then resume when they begin the new activity.  If you work your way up to being able to do these videos without having to stop, I expect you will see significant improvements in your pain.  If you enjoy his videos and would like to find out more you can look on his website: [email or web address]  He has a workout streaming option as well as a DVD set available for purchase.  Amazon has the best price for his DVDs.     Please perform the exercise program that we have prepared for you and gone over in detail on a daily basis.  In addition to the handout you were provided you can access your program through: [email or web address]   Your unique program code is:  [email or web address]    Pennsaid instructions: You have been given a sample/prescription for Pennsaid, a topical medication.     You are to apply this gel to your injured body part twice daily (morning and evening).   A little goes a long way so you can use about a pea-sized amount for each area.   Spread this small amount over the area into a thin film and let it dry.   Be sure that you do not rub the gel into your skin for more than 10  or 15 seconds otherwise it can irritate you skin.    Once you apply the gel, please do not put any other lotion or clothing in contact with that area for 30 minutes to allow the gel to absorb into your skin.   Some people are sensitive to the medication and can develop a sunburn-like rash.  If you have only mild symptoms it is okay to continue to use the medication but if you have any breakdown of your skin you should discontinue its use and please let us know.   If you have been written a prescription for Pennsaid, you will receive a pump bottle of this topical gel through a mail order pharmacy.  The instructions on the bottle will say to apply two pumps twice a day which may be too much gel for your particular area so use the pea-sized amount as your guide.   Instructions for Duexis, Pennsaid and Vimovo:  Your prescription will be filled through a participating [facility] mail order pharmacy.  You will receive a phone call or text from one of the participating pharmacies which can be located in any state in the [place].  You must communicate directly with them to have this medication filled.  When the pharmacy contacts you, they will need your mailing address (for shipment of the medication) andy they will need payment information if you have  a copay (typically no more than $10). If you have not heard from them 2-3 days after your appointment with Dr. Paulla Fore, contact [facility] directly at [phone].

## 2018-03-28 NOTE — Progress Notes (Signed)
Juanda Bond. [place], [place] at [place]  Alec Downs - 32 y.o. male MRN [id number]  Date of birth: Jun 04, 1985  Visit Date: 03/28/2018  PCP: Tammi Sou, MD   Referred by: Tammi Sou, MD   Scribe(s) for today's visit: Alec Downs, CMA  SUBJECTIVE:  Alec Alken "[name]" is here for Initial Assessment (L knee pain)  Referred by: Dr. Shawnie Dapper  HPI: 03/28/2018: His L knee pain symptoms INITIALLY: Began 6-12 months ago and has been getting progressively worse. Sx started after he began doing more cardio.  Described as moderate aching and throbbing, non-radiating. Worsened with stairs, running, weight bearing, side stepping.  Improved with rest.  Additional associated symptoms include: Pain is lateral and deep to the patella. He has noticed popping in the knee. He reports that the knee has buckled a few times while going down stairs, he has not fallen d/t this. He reports occasional swelling around the knee. He played soccer for several years and recalls no previous injury to the knee. He reports that he has put on a lot of weight over the past couple of years. He works with a Physiological scientist, trainer is aware of the knee pain and is working with him to do more body weight exercises. He reports "goose egg" that pops up on the lateral aspect of the knee after being more active.   At this time symptoms are worsening compared to onset, progressively worse over the past 6 months.  He has been bracing with no relief. He has tried Tylenol and IBU. He was given HEP by Dr. Anitra Lauth. He has tried icing the knee with temporary relief.   He reports hx of LBP   No recent XR of L knee.  He has recent XR of his back and it showed that R hip is higher than L hip. Done with chiropractor [place]   REVIEW OF SYSTEMS: Denies night time disturbances. Denies fevers, chills, or night sweats. Denies unexplained  weight loss. Denies personal history of cancer. Denies changes in bowel or bladder habits. Denies recent unreported falls. Denies new or worsening dyspnea or wheezing. Denies headaches or dizziness.  Reports tingling or weakness in the L knee.   Denies dizziness or presyncopal episodes Reports lower extremity edema    HISTORY:  Prior history reviewed and updated per electronic medical record.  Social History   Occupational History  . Not on file  Tobacco Use  . Smoking status: Never Smoker  . Smokeless tobacco: Never Used  Substance and Sexual Activity  . Alcohol use: No  . Drug use: No  . Sexual activity: Not on file   Social History   Social History Narrative   Married, 2 daughters (as of 03/2018).   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   No T/A/Ds.   Past Medical History:  Diagnosis Date  . Allergic rhinitis   . Elevated blood pressure reading without diagnosis of hypertension   . Family history of colon cancer    Uncle with col cancer, and father with adenomatous polyps in his 95s.  . Frequent headaches    secondary to chronic anxiety---CT scan brain normal in the past per pt report.  Marland Kitchen GAD (generalized anxiety disorder)   . GERD (gastroesophageal reflux disease)   . Obesity, Class I, BMI 30-34.9    Past Surgical History:  Procedure Laterality Date  . HAIR TRANSPLANT  09/2015  [facility]   family history includes Aneurysm in his paternal grandmother; Arthritis in his father; Diabetes in his maternal grandmother; Hypertension in his mother; Pancreatic cancer in his maternal uncle; Stroke in his maternal grandfather, maternal grandmother, paternal grandfather, and paternal grandmother.  DATA OBTAINED & REVIEWED:   Recent Labs    03/15/18 1113  CALCIUM 10.0  AST 21  ALT 37  TSH 2.20   No problems updated. No specialty comments available. X-rays left knee 03/28/2018: Negative.  OBJECTIVE:  VS:  HT:5\' 9"  (175.3 cm)   WT:232 lb 6.4  oz (105.4 kg)  BMI:34.3    BP:130/82  HR:71bpm  TEMP: ( )  RESP:95 %   PHYSICAL EXAM: CONSTITUTIONAL: Well-developed, Well-nourished and In no acute distress EYES: Pupils are equal., EOM intact without nystagmus. and No scleral icterus. Psychiatric: Alert & appropriately interactive. and Not depressed or anxious appearing. EXTREMITY EXAM: Warm and well perfused Left knee: Overall well aligned without significant deformity.  He has moderate tenderness over the medial joint line and over the pes bursa.  Extensor mechanism strength is intact.  He has no pain with varus or valgus stressing is ligamentously stable.  He has minimal pain with McMurray's.  Negative Thessaly.   ASSESSMENT   1. Chronic pain of left knee      PROCEDURES:  Home Therapeutic exercises prescribed per procedure note.      PLAN:  Pertinent additional documentation may be included in corresponding procedure notes, imaging studies, problem based documentation and patient instructions.  No problem-specific Assessment & Plan notes found for this encounter.  Symptoms are consistent with general overuse of the knee with poor patellar tracking and likely some component of Pes bursitis versus medial joint line irritation.  He should respond well to topical anti-inflammatories and home therapeutic exercises  RICE (Rest, ICE, Compression, Elevation) principles reviewed with the patient.  Activity modifications and the importance of avoiding exacerbating activities (limiting pain to no more than a 4 / 10 during or following activity) recommended and discussed. Discussed red flag symptoms that warrant earlier emergent evaluation and patient voices understanding. Meds ordered this encounter  Medications  . Diclofenac Sodium (PENNSAID) 2 % SOLN    Sig: Place 1 application onto the skin 2 (two) times daily for 1 day.    Dispense:  8 g    Refill:  0  . DISCONTD: Diclofenac Sodium (PENNSAID) 2 % SOLN    Sig: Place 1  application onto the skin 2 (two) times daily.    Dispense:  112 g    Refill:  2    Home Phone      [phone] Mobile          [phone]    Lab Orders  No laboratory test(s) ordered today   Imaging Orders     DG Knee AP/LAT W/Sunrise Left Referral Orders  No referral(s) requested today    Return in about 6 weeks (around 05/09/2018).     CMA/ATC served as Education administrator during this visit. History, Physical, and Plan performed by medical provider. Documentation and orders reviewed and attested to.      Gerda Diss, [facility] Physician

## 2018-03-28 NOTE — Progress Notes (Signed)
PROCEDURE NOTE: THERAPEUTIC EXERCISES (97110) 15 minutes spent for Therapeutic exercises as below and as referenced in the AVS.  This included exercises focusing on stretching, strengthening, with significant focus on eccentric aspects.   Proper technique shown and discussed handout in great detail with ATC.  All questions were discussed and answered.   Long term goals include an improvement in range of motion, strength, endurance as well as avoiding reinjury. Frequency of visits is one time as determined during today's  office visit. Frequency of exercises to be performed is as per handout.  EXERCISES REVIEWED:  Alec Downs Exercises  Hip ABduction strengthening with focus on Fronton Recruitment

## 2018-04-18 ENCOUNTER — Encounter: Payer: Self-pay | Admitting: Family Medicine

## 2018-04-18 ENCOUNTER — Ambulatory Visit: Payer: BLUE CROSS/BLUE SHIELD | Admitting: Family Medicine

## 2018-04-18 NOTE — Progress Notes (Deleted)
OFFICE VISIT  04/18/2018   CC: No chief complaint on file.    HPI:    Patient is a 32 y.o. Caucasian male who presents for 1 mo f/u anxiety. He tends to be high strung and uses this to his advantage much of the time---it is motivation to get lots of things done, etc.  However, he occasionally lets it all overwhelm him and we decided to try a treatment approach of prn ativan last o/v. He felt worse on duloxetine and has hx of being too sensitive to the effects of stimulants.  Past Medical History:  Diagnosis Date  . Allergic rhinitis   . Elevated blood pressure reading without diagnosis of hypertension   . Family history of colon cancer    Uncle with col cancer, and father with adenomatous polyps in his 41s.  . Frequent headaches    secondary to chronic anxiety---CT scan brain normal in the past per pt report.  Marland Kitchen GAD (generalized anxiety disorder)   . GERD (gastroesophageal reflux disease)   . Obesity, Class I, BMI 30-34.9     Past Surgical History:  Procedure Laterality Date  . HAIR TRANSPLANT  09/2015   [facility]    Outpatient Medications Prior to Visit  Medication Sig Dispense Refill  . cetirizine (ZYRTEC) 10 MG tablet Take 10 mg by mouth daily.    . Diclofenac Sodium (PENNSAID) 2 % SOLN Place 1 application onto the skin 2 (two) times daily. 112 g 2  . esomeprazole (NEXIUM) 20 MG capsule Take 20 mg by mouth daily at 12 noon.    Marland Kitchen LORazepam (ATIVAN) 0.5 MG tablet 1-2 tabs po bid prn moderate-to-severe anxiety 60 tablet 0  . rizatriptan (MAXALT) 10 MG tablet Take 1 tablet (10 mg total) by mouth as needed for migraine. May repeat in 2 hours if needed 9 tablet 1   No facility-administered medications prior to visit.     Allergies  Allergen Reactions  . Duloxetine Other (See Comments)    Shaky, paranoid      ROS As per HPI  PE: There were no vitals taken for this visit. ***  LABS:    Chemistry      Component Value Date/Time   NA 140 03/15/2018 1113   K 4.4  03/15/2018 1113   CL 101 03/15/2018 1113   CO2 33 (H) 03/15/2018 1113   BUN 16 03/15/2018 1113   CREATININE 0.97 03/15/2018 1113      Component Value Date/Time   CALCIUM 10.0 03/15/2018 1113   ALKPHOS 45 03/15/2018 1113   AST 21 03/15/2018 1113   ALT 37 03/15/2018 1113   BILITOT 0.6 03/15/2018 1113      IMPRESSION AND PLAN:  No problem-specific Assessment & Plan notes found for this encounter.   An After Visit Summary was printed and given to the patient.  FOLLOW UP: No follow-ups on file.  Signed:  Crissie Sickles, MD           04/18/2018

## 2018-04-20 ENCOUNTER — Ambulatory Visit: Payer: BLUE CROSS/BLUE SHIELD | Admitting: Family Medicine

## 2018-05-09 ENCOUNTER — Encounter: Payer: Self-pay | Admitting: Family Medicine

## 2018-05-10 ENCOUNTER — Encounter: Payer: Self-pay | Admitting: Sports Medicine

## 2018-05-10 ENCOUNTER — Ambulatory Visit (INDEPENDENT_AMBULATORY_CARE_PROVIDER_SITE_OTHER): Payer: BLUE CROSS/BLUE SHIELD

## 2018-05-10 ENCOUNTER — Ambulatory Visit (INDEPENDENT_AMBULATORY_CARE_PROVIDER_SITE_OTHER): Payer: BLUE CROSS/BLUE SHIELD | Admitting: Sports Medicine

## 2018-05-10 VITALS — BP 130/88 | HR 56 | Ht 69.0 in | Wt 235.2 lb

## 2018-05-10 DIAGNOSIS — M549 Dorsalgia, unspecified: Secondary | ICD-10-CM | POA: Diagnosis not present

## 2018-05-10 DIAGNOSIS — M545 Low back pain, unspecified: Secondary | ICD-10-CM

## 2018-05-10 DIAGNOSIS — G8929 Other chronic pain: Secondary | ICD-10-CM

## 2018-05-10 DIAGNOSIS — M479 Spondylosis, unspecified: Secondary | ICD-10-CM | POA: Diagnosis not present

## 2018-05-10 DIAGNOSIS — M25562 Pain in left knee: Secondary | ICD-10-CM | POA: Diagnosis not present

## 2018-05-10 DIAGNOSIS — M5442 Lumbago with sciatica, left side: Secondary | ICD-10-CM | POA: Diagnosis not present

## 2018-05-10 DIAGNOSIS — M4300 Spondylolysis, site unspecified: Secondary | ICD-10-CM

## 2018-05-10 MED ORDER — CYCLOBENZAPRINE HCL 10 MG PO TABS: 10.0000 mg | ORAL_TABLET | Freq: Every day | ORAL | 1 refills | Status: DC

## 2018-05-10 NOTE — Progress Notes (Signed)
Alec Downs. Alec Downs, Alec Downs at [place]  Alec Downs - 32 y.o. male MRN [id number]  Date of birth: January 26, 1986  Visit Date:   PCP: Tammi Sou, MD   Referred by: Tammi Sou, MD   SUBJECTIVE:  Chief Complaint  Patient presents with  . Follow-up    L knee pain    HPI: Patient reports almost complete resolution in his left knee pain. He reports also having worsening left-sided back pain since performing the therapeutic exercises.  He has had issues with back pain intermittently since college where he had a left-sided L4-5 stress fracture.  He has not any x-rays since that time.  He reports crutches, running for more than 15 to 20 minutes and performing any type of lower body weight seems to exacerbate his low back.  Stairs sidestepping and side-to-side motions in general as well as twisting and lumbar extension worsen his pain.  He has been using ibuprofen and Tylenol as well as Pennsaid on the knee but not on the back.  He has been working with a Physiological scientist and reports this intermittently actually flares up his symptoms.  REVIEW OF SYSTEMS: Denies night time disturbances. Denies fevers, chills, or night sweats. Denies unexplained weight loss. Denies personal history of cancer. Denies changes in bowel or bladder habits. Denies recent unreported falls. Denies new or worsening dyspnea or wheezing. Denies headaches or dizziness.  Reports numbness, tingling or weakness  In the extremities.  He does have slight left-sided foot numbness and tingling Denies dizziness or presyncopal episodes Denies lower extremity edema   HISTORY:  Prior history reviewed and updated per electronic medical record.  Social History   Occupational History  . Not on file  Tobacco Use  . Smoking status: Never Smoker  . Smokeless tobacco: Never Used  Substance and Sexual Activity  . Alcohol use: No  . Drug use: No  .  Sexual activity: Not on file   Social History   Social History Narrative   Married, 2 daughters (as of 03/2018).   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   No T/A/Ds.      DATA OBTAINED & REVIEWED:  Recent Labs    03/15/18 1113  CALCIUM 10.0  AST 21  ALT 37  TSH 2.20   Problem  Left Knee Pain   XR L knee 03/28/18 - normal   Spondylosis   X-rays lumbar spine 05/10/2018: He has a what appears to be left-sided spondylolysis with age advanced spondylosis.  No evidence of spondylolisthesis.  Well-maintained disc space.   Mild Intermittent Asthma Without Complication  Panic Disorder With Agoraphobia and Moderate Panic Attacks   No specialty comments available.  OBJECTIVE:  VS:  HT:5\' 9"  (175.3 cm)   WT:235 lb 3.2 oz (106.7 kg)  BMI:34.72    BP:130/88  HR: (!) 56 bpm  TEMP: ( )  RESP:94 %   PHYSICAL EXAM: CONSTITUTIONAL: Well-developed, Well-nourished and In no acute distress PSYCHIATRIC: Alert & appropriately interactive. and Not depressed or anxious appearing. RESPIRATORY: No increased work of breathing and Trachea Midline EYES: Pupils are equal., EOM intact without nystagmus. and No scleral icterus.  VASCULAR EXAM: Warm and well perfused NEURO: Strength: Normal strength in associated myotomes to manual muscle testing. and Able to heel and toe walk without difficulty Sensation: normal to light touch  MSK Exam:   lumbar spine  Well aligned No significant deformity. No overlying skin changes  No focal bony tenderness Small amount of pain with straight leg raise on the left localizing to the lumbar spine but this is mild.  He has no significant pain with popliteal compression test.  Small amount of pain with external rotation of the left hip.  He is able to heel toe walk without difficulty.  Pain is present with lumbar extension, stork testing.     ASSESSMENT  1. Chronic left-sided low back pain, unspecified whether sciatica present   2.  Chronic pain of left knee   3. Chronic left-sided low back pain with left-sided sciatica   4. Spondylosis   5. Spondylolysis without spondylolisthesis     PLAN:  Pertinent additional documentation may be included in corresponding procedure notes, imaging studies, problem based documentation and patient instructions.  Procedures:  None  Medications:  Meds ordered this encounter  Medications  . cyclobenzaprine (FLEXERIL) 10 MG tablet    Sig: Take 1 tablet (10 mg total) by mouth at bedtime.    Dispense:  30 tablet    Refill:  1    Discussion/Instructions: Left knee pain This is almost completely resolved today. Okay to continue with intermittent Pennsaid as needed.  Spondylosis He has had issues with this intermittently since soccer in college.  Does report having prior pars stress fractures. We will refer him to physical therapy to have him work on core stabilization as well as appropriate hip flexor stretching as he is markedly tight and this is contributing to some of his symptoms.  Start Flexeril nightly as needed.  Referral placed to physical therapy Discussed red flag symptoms that warrant earlier emergent evaluation and patient voices understanding. Activity modifications and the importance of avoiding exacerbating activities (limiting pain to no more than a 4 / 10 during or following activity) recommended and discussed.  If any lack of improvement: consider further diagnostic evaluation with MRI of the lumbar spine   Return in about 8 weeks (around 07/05/2018).          Gerda Diss, [facility] Physician

## 2018-05-10 NOTE — Assessment & Plan Note (Signed)
He has had issues with this intermittently since soccer in college.  Does report having prior pars stress fractures. We will refer him to physical therapy to have him work on core stabilization as well as appropriate hip flexor stretching as he is markedly tight and this is contributing to some of his symptoms.  Start Flexeril nightly as needed.

## 2018-05-10 NOTE — Assessment & Plan Note (Signed)
This is almost completely resolved today. Okay to continue with intermittent Pennsaid as needed.

## 2018-05-18 ENCOUNTER — Ambulatory Visit: Payer: BLUE CROSS/BLUE SHIELD | Admitting: Physical Therapy

## 2018-05-20 ENCOUNTER — Ambulatory Visit: Payer: BLUE CROSS/BLUE SHIELD | Admitting: Family Medicine

## 2018-06-01 DIAGNOSIS — J069 Acute upper respiratory infection, unspecified: Secondary | ICD-10-CM | POA: Diagnosis not present

## 2018-06-01 DIAGNOSIS — J019 Acute sinusitis, unspecified: Secondary | ICD-10-CM | POA: Diagnosis not present

## 2018-06-06 ENCOUNTER — Ambulatory Visit: Payer: BLUE CROSS/BLUE SHIELD | Admitting: Family Medicine

## 2018-06-09 DIAGNOSIS — J209 Acute bronchitis, unspecified: Secondary | ICD-10-CM | POA: Diagnosis not present

## 2018-06-10 ENCOUNTER — Encounter: Payer: Self-pay | Admitting: *Deleted

## 2018-06-10 ENCOUNTER — Ambulatory Visit (INDEPENDENT_AMBULATORY_CARE_PROVIDER_SITE_OTHER): Payer: BLUE CROSS/BLUE SHIELD | Admitting: Family Medicine

## 2018-06-10 ENCOUNTER — Encounter: Payer: Self-pay | Admitting: Family Medicine

## 2018-06-10 VITALS — BP 145/76 | HR 83 | Temp 98.3°F | Resp 16 | Ht 69.0 in | Wt 226.5 lb

## 2018-06-10 DIAGNOSIS — E669 Obesity, unspecified: Secondary | ICD-10-CM | POA: Diagnosis not present

## 2018-06-10 DIAGNOSIS — R030 Elevated blood-pressure reading, without diagnosis of hypertension: Secondary | ICD-10-CM

## 2018-06-10 DIAGNOSIS — F411 Generalized anxiety disorder: Secondary | ICD-10-CM

## 2018-06-10 MED ORDER — LORAZEPAM 0.5 MG PO TABS: ORAL_TABLET | ORAL | 5 refills | Status: DC

## 2018-06-10 NOTE — Progress Notes (Signed)
OFFICE VISIT  06/10/2018   CC:  Chief Complaint  Patient presents with  . Follow-up    RCI, pt is not fasting.      HPI:    Patient is a 33 y.o. Caucasian male who presents accompanied by his toddler daughter and infant daughter for "discuss meds". I have treated him in the past for his generalized anxiety, ADHD tendencies, near panic-->he did not tolerate cymbalta and low dose vyvanse made him feel "overfocused". Last visit 3 mo ago we decided that since he seemed to benefit from his personality, type A, "always on the go" features, we would try using only a prn medication for peak days of anxiety: ativan 0.5mg , 1-2 bid prn, #60, no RF.  Interim hx: Happy with lorazepam, takes whole tab every night and feels like this helps anxiety, sleep, and overall attitude during the daytime.  No depressed mood.  No side effect.  Has had some resp illnesses lately, just recently took a Z pack and got back to normal.  Sx's returned yesterday, went to UC, rx'd prednisone at that time.  He feels better. The prednisone could be contributing to his elevated bp today. BP's outside of office "up and down" but not checking it much. Not exercising much due to recent illnesses.  Past Medical History:  Diagnosis Date  . Allergic rhinitis   . Elevated blood pressure reading without diagnosis of hypertension   . Family history of colon cancer    Uncle with col cancer, and father with adenomatous polyps in his 13s.  . Frequent headaches    secondary to chronic anxiety---CT scan brain normal in the past per pt report.  Marland Kitchen GAD (generalized anxiety disorder)   . GERD (gastroesophageal reflux disease)   . Obesity, Class I, BMI 30-34.9     Past Surgical History:  Procedure Laterality Date  . HAIR TRANSPLANT  09/2015   [facility]    Outpatient Medications Prior to Visit  Medication Sig Dispense Refill  . cetirizine (ZYRTEC) 10 MG tablet Take 10 mg by mouth daily.    . cyclobenzaprine (FLEXERIL) 10 MG  tablet Take 1 tablet (10 mg total) by mouth at bedtime. 30 tablet 1  . esomeprazole (NEXIUM) 20 MG capsule Take 20 mg by mouth daily at 12 noon.    . predniSONE (DELTASONE) 10 MG tablet Take 10 mg by mouth daily with breakfast. Taking a taper    . rizatriptan (MAXALT) 10 MG tablet Take 1 tablet (10 mg total) by mouth as needed for migraine. May repeat in 2 hours if needed 9 tablet 1  . LORazepam (ATIVAN) 0.5 MG tablet 1-2 tabs po bid prn moderate-to-severe anxiety 60 tablet 0  . Diclofenac Sodium (PENNSAID) 2 % SOLN Place 1 application onto the skin 2 (two) times daily. (Patient not taking: Reported on 06/10/2018) 112 g 2   No facility-administered medications prior to visit.     Allergies  Allergen Reactions  . Duloxetine Other (See Comments)    Shaky, paranoid      ROS As per HPI  PE: Blood pressure (!) 145/76, pulse 83, temperature 98.3 F (36.8 C), temperature source Oral, resp. rate 16, height 5\' 9"  (1.753 m), weight 226 lb 8 oz (102.7 kg), SpO2 93 %. Gen: Alert, well appearing.  Patient is oriented to person, place, time, and situation. AFFECT: pleasant, lucid thought and speech. No further exam today.  LABS:    Chemistry      Component Value Date/Time   NA 140 03/15/2018 1113  K 4.4 03/15/2018 1113   CL 101 03/15/2018 1113   CO2 33 (H) 03/15/2018 1113   BUN 16 03/15/2018 1113   CREATININE 0.97 03/15/2018 1113      Component Value Date/Time   CALCIUM 10.0 03/15/2018 1113   ALKPHOS 45 03/15/2018 1113   AST 21 03/15/2018 1113   ALT 37 03/15/2018 1113   BILITOT 0.6 03/15/2018 1113      IMPRESSION AND PLAN:  1) GAD: doing well with just an hs dose of lorazepam, only occasional daytime dose. I think this is the right treatment for him. Controlled substance contract reviewed with patient today.  Patient signed this and it will be placed in the chart.   Will do UDS next f/u 6 mo.  2) Elev blood pressure w/out dx htn: he'll keep a closer check on this at home, goal  <130/80 discussed today. Call or return if consistently >140/90.  An After Visit Summary was printed and given to the patient.  FOLLOW UP: Return in about 6 months (around 12/09/2018) for routine chronic illness f/u.  Signed:  Crissie Sickles, MD           06/10/2018

## 2018-06-14 DIAGNOSIS — R05 Cough: Secondary | ICD-10-CM | POA: Diagnosis not present

## 2018-06-14 DIAGNOSIS — J018 Other acute sinusitis: Secondary | ICD-10-CM | POA: Diagnosis not present

## 2018-06-17 ENCOUNTER — Encounter: Payer: Self-pay | Admitting: Sports Medicine

## 2018-07-02 DIAGNOSIS — J019 Acute sinusitis, unspecified: Secondary | ICD-10-CM | POA: Diagnosis not present

## 2018-07-07 ENCOUNTER — Ambulatory Visit: Payer: BLUE CROSS/BLUE SHIELD | Admitting: Sports Medicine

## 2018-10-17 ENCOUNTER — Ambulatory Visit (INDEPENDENT_AMBULATORY_CARE_PROVIDER_SITE_OTHER): Payer: BLUE CROSS/BLUE SHIELD | Admitting: Family Medicine

## 2018-10-17 ENCOUNTER — Other Ambulatory Visit: Payer: Self-pay

## 2018-10-17 ENCOUNTER — Encounter: Payer: Self-pay | Admitting: Family Medicine

## 2018-10-17 VITALS — Temp 97.9°F | Ht 69.0 in | Wt 222.0 lb

## 2018-10-17 DIAGNOSIS — T753XXD Motion sickness, subsequent encounter: Secondary | ICD-10-CM | POA: Diagnosis not present

## 2018-10-17 DIAGNOSIS — F411 Generalized anxiety disorder: Secondary | ICD-10-CM

## 2018-10-17 MED ORDER — TRANSDERM-SCOP (1.5 MG) 1 MG/3DAYS TD PT72: MEDICATED_PATCH | TRANSDERMAL | 1 refills | Status: DC

## 2018-10-17 MED ORDER — LORAZEPAM 0.5 MG PO TABS: ORAL_TABLET | ORAL | 4 refills | Status: DC

## 2018-10-17 NOTE — Progress Notes (Signed)
Virtual Visit via Video Note  I connected with Alec Downs on 10/17/18 at  2:20 PM EDT by a video enabled telemedicine application and verified that I am speaking with the correct person using two identifiers.  Location patient: home Location provider:work or home office Persons participating in the virtual visit: patient, provider  I discussed the limitations of evaluation and management by telemedicine and the availability of in person appointments. The patient expressed understanding and agreed to proceed.  Telemedicine visit is a necessity given the COVID-19 restrictions in place at the current time.  HPI: 33 y/o WM being seen today for f/u GAD with anxiety-related insomnia. States he is doing very well.  He requests rx for scopolamine b/c he will be going fishing 50 miles off the [place] very soon and tends to get seasick with this type of thing.  Scopolamine has helped well for him in the past.  He says his anxiety is improved overall.  Uses the lorazepam almost every night to help calm his mind so he can initiate and maintain sleep better.  Also uses it sometimes in daytime prior to high stress meeting for work, etc.  He is feeling better since less stressed and better sleep-->migraines very infrequent now and less severe when he does have one.    ROS: no CP, no SOB, no wheezing, no cough, no dizziness, no HAs, no rashes, no melena/hematochezia.  No polyuria or polydipsia.  No myalgias or arthralgias.   Past Medical History:  Diagnosis Date  . Allergic rhinitis   . Elevated blood pressure reading without diagnosis of hypertension   . Family history of colon cancer    Uncle with col cancer, and father with adenomatous polyps in his 64s.  . Frequent headaches    secondary to chronic anxiety---CT scan brain normal in the past per Alec Downs report.  Marland Kitchen GAD (generalized anxiety disorder)   . GERD (gastroesophageal reflux disease)   . Obesity, Class I, BMI 30-34.9     Past Surgical  History:  Procedure Laterality Date  . HAIR TRANSPLANT  09/2015   [facility]    Family History  Problem Relation Age of Onset  . Hypertension Mother   . Arthritis Father   . Stroke Maternal Grandmother   . Diabetes Maternal Grandmother   . Stroke Maternal Grandfather   . Stroke Paternal Grandmother   . Aneurysm Paternal Grandmother   . Stroke Paternal Grandfather   . Pancreatic cancer Maternal Uncle     SOCIAL HX: Married, 2 daughters, owns a Mudlogger.  No T/A/Ds.   Current Outpatient Medications:  .  cetirizine (ZYRTEC) 10 MG tablet, Take 10 mg by mouth daily., Disp: , Rfl:  .  cyclobenzaprine (FLEXERIL) 10 MG tablet, Take 1 tablet (10 mg total) by mouth at bedtime., Disp: 30 tablet, Rfl: 1 .  esomeprazole (NEXIUM) 20 MG capsule, Take 20 mg by mouth daily at 12 noon., Disp: , Rfl:  .  LORazepam (ATIVAN) 0.5 MG tablet, 1 tab po bid prn moderate-to-severe anxiety, Disp: 60 tablet, Rfl: 5 .  rizatriptan (MAXALT) 10 MG tablet, Take 1 tablet (10 mg total) by mouth as needed for migraine. May repeat in 2 hours if needed, Disp: 9 tablet, Rfl: 1 .  TRANSDERM-SCOP, 1.5 MG, 1 MG/3DAYS, PLACE 1 PATCH (1.5 MG TOTAL) ONTO THE SKIN EVERY 3 (THREE) DAYS., Disp: , Rfl:   EXAM:  VITALS per patient if applicable: Temp 93.5 F (36.6 C) (Oral)   Ht 5\' 9"  (1.753 m)  Wt 222 lb (100.7 kg)   BMI 32.78 kg/m    GENERAL: alert, oriented, appears well and in no acute distress  HEENT: atraumatic, conjunttiva clear, no obvious abnormalities on inspection of external nose and ears  NECK: normal movements of the head and neck  LUNGS: on inspection no signs of respiratory distress, breathing rate appears normal, no obvious gross SOB, gasping or wheezing  CV: no obvious cyanosis  MS: moves all visible extremities without noticeable abnormality  PSYCH/NEURO: pleasant and cooperative, no obvious depression or anxiety, speech and thought processing grossly intact  LABS: none today     Chemistry      Component Value Date/Time   NA 140 03/15/2018 1113   K 4.4 03/15/2018 1113   CL 101 03/15/2018 1113   CO2 33 (H) 03/15/2018 1113   BUN 16 03/15/2018 1113   CREATININE 0.97 03/15/2018 1113      Component Value Date/Time   CALCIUM 10.0 03/15/2018 1113   ALKPHOS 45 03/15/2018 1113   AST 21 03/15/2018 1113   ALT 37 03/15/2018 1113   BILITOT 0.6 03/15/2018 1113     Lab Results  Component Value Date   WBC 5.7 03/15/2018   HGB 16.3 03/15/2018   HCT 48.2 03/15/2018   MCV 89.0 03/15/2018   PLT 228.0 03/15/2018    ASSESSMENT AND PLAN:  Discussed the following assessment and plan:  1) GAD with situational anxiety: he is doing very well with qd-bid use of lorazepam 0.5mg . CSC dated 06/10/2018. Will obtain UDS at next f/u in office in 6 mo. RF lorazepam today (he has filled this med a total of 3 times-->03/15/18, 06/10/18, 08/31/18.  2) Motion sickness: scopolamine patches eRx'd in advance of his fishing trip to [place].  I discussed the assessment and treatment plan with the patient. The patient was provided an opportunity to ask questions and all were answered. The patient agreed with the plan and demonstrated an understanding of the instructions.   The patient was advised to call back or seek an in-person evaluation if the symptoms worsen or if the condition fails to improve as anticipated.  F/u: 6 mo CPE  Signed:  Crissie Sickles, MD           10/17/2018

## 2018-10-29 DIAGNOSIS — T753XXA Motion sickness, initial encounter: Secondary | ICD-10-CM | POA: Diagnosis not present

## 2019-02-08 ENCOUNTER — Ambulatory Visit (INDEPENDENT_AMBULATORY_CARE_PROVIDER_SITE_OTHER): Payer: BC Managed Care – PPO | Admitting: Family Medicine

## 2019-02-08 ENCOUNTER — Other Ambulatory Visit: Payer: Self-pay

## 2019-02-08 ENCOUNTER — Encounter: Payer: Self-pay | Admitting: Family Medicine

## 2019-02-08 VITALS — BP 147/91 | Temp 98.9°F | Wt 229.0 lb

## 2019-02-08 DIAGNOSIS — G5603 Carpal tunnel syndrome, bilateral upper limbs: Secondary | ICD-10-CM

## 2019-02-08 NOTE — Progress Notes (Signed)
Virtual Visit via Video Note  I connected with pt on 02/08/19 at  2:30 PM EDT by a video enabled telemedicine application and verified that I am speaking with the correct person using two identifiers.  Location patient: home Location provider:work or home office Persons participating in the virtual visit: patient, provider  I discussed the limitations of evaluation and management by telemedicine and the availability of in person appointments. The patient expressed understanding and agreed to proceed.  Telemedicine visit is a necessity given the COVID-19 restrictions in place at the current time.  HPI: Alec Downs is a 33 y/o WM being seen today for bilat  hand pain and hand numbness. Happens when typing, lifting wt's, when picking his daughter up. Began bothering him 2-3 mo ago approx.  Becoming more persistent. Sometimes some shooting pain. Feet and hands feel cool.  Hands get pale at times, but not fee.  No bluish tint/cyanosis. When he flexes wrists it comes on more. R hand >>left.  Uses R hand more during activities.  No injury prior to onset of sx's.  No weakness of arms/wrists.  No neck pain. ROS: See pertinent positives and negatives per HPI.  Past Medical History:  Diagnosis Date  . Allergic rhinitis   . Elevated blood pressure reading without diagnosis of hypertension   . Family history of colon cancer    Uncle with col cancer, and father with adenomatous polyps in his 46s.  . Frequent headaches    secondary to chronic anxiety---CT scan brain normal in the past per pt report.  Marland Kitchen GAD (generalized anxiety disorder)   . GERD (gastroesophageal reflux disease)   . Obesity, Class I, BMI 30-34.9     Past Surgical History:  Procedure Laterality Date  . HAIR TRANSPLANT  09/2015   [facility]    Family History  Problem Relation Age of Onset  . Hypertension Mother   . Arthritis Father   . Stroke Maternal Grandmother   . Diabetes Maternal Grandmother   . Stroke Maternal Grandfather    . Stroke Paternal Grandmother   . Aneurysm Paternal Grandmother   . Stroke Paternal Grandfather   . Pancreatic cancer Maternal Uncle     SOCIAL HX:  Social History   Socioeconomic History  . Marital status: Married    Spouse name: Not on file  . Number of children: Not on file  . Years of education: Not on file  . Highest education level: Not on file  Occupational History  . Not on file  Social Needs  . Financial resource strain: Not on file  . Food insecurity    Worry: Not on file    Inability: Not on file  . Transportation needs    Medical: Not on file    Non-medical: Not on file  Tobacco Use  . Smoking status: Never Smoker  . Smokeless tobacco: Never Used  Substance and Sexual Activity  . Alcohol use: No  . Drug use: No  . Sexual activity: Not on file  Lifestyle  . Physical activity    Days per week: Not on file    Minutes per session: Not on file  . Stress: Not on file  Relationships  . Social Herbalist on phone: Not on file    Gets together: Not on file    Attends religious service: Not on file    Active member of club or organization: Not on file    Attends meetings of clubs or organizations: Not on file  Relationship status: Not on file  Other Topics Concern  . Not on file  Social History Narrative   Married, 2 daughters (as of 03/2018).   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   No T/A/Ds.    Current Outpatient Medications:  .  cetirizine (ZYRTEC) 10 MG tablet, Take 10 mg by mouth daily., Disp: , Rfl:  .  cyclobenzaprine (FLEXERIL) 10 MG tablet, Take 1 tablet (10 mg total) by mouth at bedtime., Disp: 30 tablet, Rfl: 1 .  esomeprazole (NEXIUM) 20 MG capsule, Take 20 mg by mouth daily at 12 noon., Disp: , Rfl:  .  LORazepam (ATIVAN) 0.5 MG tablet, 1 tab po bid prn moderate-to-severe anxiety, Disp: 60 tablet, Rfl: 4 .  rizatriptan (MAXALT) 10 MG tablet, Take 1 tablet (10 mg total) by mouth as needed for migraine. May  repeat in 2 hours if needed, Disp: 9 tablet, Rfl: 1 .  TRANSDERM-SCOP, 1.5 MG, 1 MG/3DAYS, PLACE 1 PATCH (1.5 MG TOTAL) ONTO THE SKIN EVERY 3 (THREE) DAYS., Disp: 10 patch, Rfl: 1  EXAM:  VITALS per patient if applicable:  GENERAL: alert, oriented, appears well and in no acute distress  HEENT: atraumatic, conjunttiva clear, no obvious abnormalities on inspection of external nose and ears  NECK: normal movements of the head and neck  LUNGS: on inspection no signs of respiratory distress, breathing rate appears normal, no obvious gross SOB, gasping or wheezing  CV: no obvious cyanosis  MS: moves all visible extremities without noticeable abnormality. I watched him to phalen's and tinel's testing after I demonstrated these to him today and both were positive, R>L.  PSYCH/NEURO: pleasant and cooperative, no obvious depression or anxiety, speech and thought processing grossly intact  LABS: none today    Chemistry      Component Value Date/Time   NA 140 03/15/2018 1113   K 4.4 03/15/2018 1113   CL 101 03/15/2018 1113   CO2 33 (H) 03/15/2018 1113   BUN 16 03/15/2018 1113   CREATININE 0.97 03/15/2018 1113      Component Value Date/Time   CALCIUM 10.0 03/15/2018 1113   ALKPHOS 45 03/15/2018 1113   AST 21 03/15/2018 1113   ALT 37 03/15/2018 1113   BILITOT 0.6 03/15/2018 1113     Lab Results  Component Value Date   WBC 5.7 03/15/2018   HGB 16.3 03/15/2018   HCT 48.2 03/15/2018   MCV 89.0 03/15/2018   PLT 228.0 03/15/2018   ASSESSMENT AND PLAN:  Discussed the following assessment and plan:  1) Carpal tunnel syndrome bilat, R>>L. Discussed dx.  Reassured him he had no circulation problems causing his sx's. I recommended relative rest and also use of wrist splints to wear all night AND when he can during the day.  -we discussed possible serious and likely etiologies, options for evaluation and workup, limitations of telemedicine visit vs in person visit, treatment,  treatment risks and precautions. Pt prefers to treat via telemedicine empirically rather then risking or undertaking an in person visit at this moment. Patient agrees to seek prompt in person care if worsening, new symptoms arise, or if is not improving with treatment.   I discussed the assessment and treatment plan with the patient. The patient was provided an opportunity to ask questions and all were answered. The patient agreed with the plan and demonstrated an understanding of the instructions.   The patient was advised to call back or seek an in-person evaluation if the symptoms worsen or if  the condition fails to improve as anticipated.  F/u: as needed.  Signed:  Crissie Sickles, MD           02/08/2019

## 2019-02-11 DIAGNOSIS — J069 Acute upper respiratory infection, unspecified: Secondary | ICD-10-CM | POA: Diagnosis not present

## 2019-02-23 ENCOUNTER — Ambulatory Visit: Payer: BC Managed Care – PPO | Admitting: Family Medicine

## 2019-03-10 ENCOUNTER — Encounter: Payer: Self-pay | Admitting: Family Medicine

## 2019-03-10 ENCOUNTER — Ambulatory Visit (INDEPENDENT_AMBULATORY_CARE_PROVIDER_SITE_OTHER): Payer: BC Managed Care – PPO | Admitting: Family Medicine

## 2019-03-10 ENCOUNTER — Other Ambulatory Visit: Payer: Self-pay

## 2019-03-10 VITALS — BP 144/86 | Wt 227.0 lb

## 2019-03-10 DIAGNOSIS — F5105 Insomnia due to other mental disorder: Secondary | ICD-10-CM

## 2019-03-10 DIAGNOSIS — L649 Androgenic alopecia, unspecified: Secondary | ICD-10-CM

## 2019-03-10 DIAGNOSIS — R030 Elevated blood-pressure reading, without diagnosis of hypertension: Secondary | ICD-10-CM | POA: Diagnosis not present

## 2019-03-10 DIAGNOSIS — F411 Generalized anxiety disorder: Secondary | ICD-10-CM | POA: Diagnosis not present

## 2019-03-10 DIAGNOSIS — F99 Mental disorder, not otherwise specified: Secondary | ICD-10-CM

## 2019-03-10 MED ORDER — LORAZEPAM 0.5 MG PO TABS: ORAL_TABLET | ORAL | 1 refills | Status: DC

## 2019-03-10 MED ORDER — FINASTERIDE 1 MG PO TABS: 1.0000 mg | ORAL_TABLET | Freq: Every day | ORAL | 3 refills | Status: DC

## 2019-03-10 NOTE — Progress Notes (Signed)
Virtual Visit via Video Note  I connected with Alec Downs on 03/10/19 at  3:30 PM EDT by a video enabled telemedicine application and verified that I am speaking with the correct person using two identifiers.  Location patient: home Location provider:work or home office Persons participating in the virtual visit: patient, provider  I discussed the limitations of evaluation and management by telemedicine and the availability of in person appointments. The patient expressed understanding and agreed to proceed.  Telemedicine visit is a necessity given the COVID-19 restrictions in place at the current time.  HPI: 33 y/o WM being seen today for "discuss medications".  Anxiety: doing well on scheduled dosing of lorazepam : 0.5mg  bid.  Sleeping good. This helps him so much that he no longer is having stress headaches or migraines. Needs RF of lorazepam. Denies depressed mood or any other side effect from the med. PMP AWARE reviewed and most recent rx fill of this med was 12/05/18, #60, rx by me.  No red flags.  Needs his propecia RF'd, says this helps immensely with prevention of hair loss. He was off the med for a while at one point in time and lost so much hair that he had to go get a 2nd hair transplant!  BP: up today, up last visit.  Review of bp's at clinic visits since 2017 show some normal and some in same range as today or a little higher.  He rarely checks bp at home.  ROS: no fevers, no CP, no SOB, no wheezing, no cough, no dizziness, no HAs, no rashes, no melena/hematochezia.  No polyuria or polydipsia.  No myalgias or arthralgias.   Past Medical History:  Diagnosis Date  . Allergic rhinitis   . Elevated blood pressure reading without diagnosis of hypertension   . Family history of colon cancer    Uncle with col cancer, and father with adenomatous polyps in his 16s.  . Frequent headaches    secondary to chronic anxiety---CT scan brain normal in the past per Alec Downs report.  Marland Kitchen GAD  (generalized anxiety disorder)   . GERD (gastroesophageal reflux disease)   . Obesity, Class I, BMI 30-34.9     Past Surgical History:  Procedure Laterality Date  . HAIR TRANSPLANT  09/2015   [facility]    Family History  Problem Relation Age of Onset  . Hypertension Mother   . Arthritis Father   . Stroke Maternal Grandmother   . Diabetes Maternal Grandmother   . Stroke Maternal Grandfather   . Stroke Paternal Grandmother   . Aneurysm Paternal Grandmother   . Stroke Paternal Grandfather   . Pancreatic cancer Maternal Uncle     SOCIAL HX:  Social History   Socioeconomic History  . Marital status: Married    Spouse name: Not on file  . Number of children: Not on file  . Years of education: Not on file  . Highest education level: Not on file  Occupational History  . Not on file  Social Needs  . Financial resource strain: Not on file  . Food insecurity    Worry: Not on file    Inability: Not on file  . Transportation needs    Medical: Not on file    Non-medical: Not on file  Tobacco Use  . Smoking status: Never Smoker  . Smokeless tobacco: Never Used  Substance and Sexual Activity  . Alcohol use: No  . Drug use: No  . Sexual activity: Not on file  Lifestyle  . Physical activity  Days per week: Not on file    Minutes per session: Not on file  . Stress: Not on file  Relationships  . Social Herbalist on phone: Not on file    Gets together: Not on file    Attends religious service: Not on file    Active member of club or organization: Not on file    Attends meetings of clubs or organizations: Not on file    Relationship status: Not on file  Other Topics Concern  . Not on file  Social History Narrative   Married, 2 daughters (as of 03/2018).   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   No T/A/Ds.      Current Outpatient Medications:  .  cetirizine (ZYRTEC) 10 MG tablet, Take 10 mg by mouth daily., Disp: , Rfl:  .   cyclobenzaprine (FLEXERIL) 10 MG tablet, Take 1 tablet (10 mg total) by mouth at bedtime., Disp: 30 tablet, Rfl: 1 .  esomeprazole (NEXIUM) 20 MG capsule, Take 20 mg by mouth daily at 12 noon., Disp: , Rfl:  .  finasteride (PROPECIA) 1 MG tablet, Take 1 mg by mouth daily., Disp: , Rfl:  .  LORazepam (ATIVAN) 0.5 MG tablet, 1 tab po bid prn moderate-to-severe anxiety, Disp: 60 tablet, Rfl: 4 .  rizatriptan (MAXALT) 10 MG tablet, Take 1 tablet (10 mg total) by mouth as needed for migraine. May repeat in 2 hours if needed (Patient not taking: Reported on 03/10/2019), Disp: 9 tablet, Rfl: 1 .  TRANSDERM-SCOP, 1.5 MG, 1 MG/3DAYS, PLACE 1 PATCH (1.5 MG TOTAL) ONTO THE SKIN EVERY 3 (THREE) DAYS. (Patient not taking: Reported on 03/10/2019), Disp: 10 patch, Rfl: 1  EXAM:  VITALS per patient if applicable: BP (!) 123456 (BP Location: Left Arm, Patient Position: Sitting, Cuff Size: Large)   Wt 227 lb (103 kg)   BMI 33.52 kg/m    GENERAL: alert, oriented, appears well and in no acute distress  HEENT: atraumatic, conjunttiva clear, no obvious abnormalities on inspection of external nose and ears  NECK: normal movements of the head and neck  LUNGS: on inspection no signs of respiratory distress, breathing rate appears normal, no obvious gross SOB, gasping or wheezing  CV: no obvious cyanosis  MS: moves all visible extremities without noticeable abnormality  PSYCH/NEURO: pleasant and cooperative, no obvious depression or anxiety, speech and thought processing grossly intact  LABS: none today    Chemistry      Component Value Date/Time   NA 140 03/15/2018 1113   K 4.4 03/15/2018 1113   CL 101 03/15/2018 1113   CO2 33 (H) 03/15/2018 1113   BUN 16 03/15/2018 1113   CREATININE 0.97 03/15/2018 1113      Component Value Date/Time   CALCIUM 10.0 03/15/2018 1113   ALKPHOS 45 03/15/2018 1113   AST 21 03/15/2018 1113   ALT 37 03/15/2018 1113   BILITOT 0.6 03/15/2018 1113      ASSESSMENT AND  PLAN:  Discussed the following assessment and plan:  1) GAD, insomnia: doing great on lorazepam 0.5mg  bid. CSC UTD.  No red flags on PMP AWARE. Renewed lorazepam 0.5mg , 1 bid, #180, RF x 1.  2) Male pattern baldness: he is doing well on propecia 1 mg qd s/p hair transplant.  3) Elevated bp w/out dx HTN: we once felt like this was due to excessive anxiety/stress, but now that he's doing well from that standpoint it is remaining up a little. He  will monitor bp daily at home for 1 mo and if avg >130/80 he is to call for appt. If consistently >150 syst or >100 diast, he is to call before then. No meds at this time.   I discussed the assessment and treatment plan with the patient. The patient was provided an opportunity to ask questions and all were answered. The patient agreed with the plan and demonstrated an understanding of the instructions.   The patient was advised to call back or seek an in-person evaluation if the symptoms worsen or if the condition fails to improve as anticipated.  F/u: 6 mo  Signed:  Crissie Sickles, MD           03/10/2019

## 2019-05-22 DIAGNOSIS — R0981 Nasal congestion: Secondary | ICD-10-CM | POA: Diagnosis not present

## 2019-05-22 DIAGNOSIS — Z20828 Contact with and (suspected) exposure to other viral communicable diseases: Secondary | ICD-10-CM | POA: Diagnosis not present

## 2019-07-04 DIAGNOSIS — Z20828 Contact with and (suspected) exposure to other viral communicable diseases: Secondary | ICD-10-CM | POA: Diagnosis not present

## 2019-07-04 DIAGNOSIS — Z03818 Encounter for observation for suspected exposure to other biological agents ruled out: Secondary | ICD-10-CM | POA: Diagnosis not present

## 2019-07-05 ENCOUNTER — Other Ambulatory Visit: Payer: Self-pay

## 2019-07-05 DIAGNOSIS — J019 Acute sinusitis, unspecified: Secondary | ICD-10-CM | POA: Diagnosis not present

## 2019-07-06 ENCOUNTER — Ambulatory Visit: Payer: BC Managed Care – PPO | Admitting: Family Medicine

## 2019-07-12 ENCOUNTER — Other Ambulatory Visit: Payer: Self-pay

## 2019-07-14 ENCOUNTER — Ambulatory Visit (INDEPENDENT_AMBULATORY_CARE_PROVIDER_SITE_OTHER): Payer: BC Managed Care – PPO | Admitting: Family Medicine

## 2019-07-14 ENCOUNTER — Other Ambulatory Visit: Payer: Self-pay

## 2019-07-14 ENCOUNTER — Encounter: Payer: Self-pay | Admitting: Family Medicine

## 2019-07-14 VITALS — BP 147/80 | HR 68 | Wt 228.0 lb

## 2019-07-14 DIAGNOSIS — I10 Essential (primary) hypertension: Secondary | ICD-10-CM | POA: Diagnosis not present

## 2019-07-14 MED ORDER — LISINOPRIL 10 MG PO TABS: 10.0000 mg | ORAL_TABLET | Freq: Every day | ORAL | 0 refills | Status: DC

## 2019-07-14 NOTE — Progress Notes (Signed)
Virtual Visit via Video Note  I connected with pt on 07/14/19 at  9:30 AM EST by a video enabled telemedicine application and verified that I am speaking with the correct person using two identifiers.  Location patient: home Location provider:work or home office Persons participating in the virtual visit: patient, provider  I discussed the limitations of evaluation and management by telemedicine and the availability of in person appointments. The patient expressed understanding and agreed to proceed.  Telemedicine visit is a necessity given the COVID-19 restrictions in place at the current time.  HPI: 34 y/o WM being seen today for f/u elevated bp w/out dx htn. A/P as of last visit 03/10/19 was: "Elevated bp w/out dx HTN: we once felt like this was due to excessive anxiety/stress, but now that he's doing well from that standpoint it is remaining up a little. He will monitor bp daily at home for 1 mo and if avg >130/80 he is to call for appt. If consistently >150 syst or >100 diast, he is to call before then. No meds at this time."  Interim hx: BPs at home around 150/80 avg. HR avg upper 60s. Feeling fine other than occas brief lightheaded feeling when feeling stressed or if really hot. Some mild stress/tension HAs as well.  ROS: no fevers, no CP, no SOB, no wheezing, no cough, no rashes, no melena/hematochezia.  No polyuria or polydipsia.  No myalgias or arthralgias.   Past Medical History:  Diagnosis Date  . Allergic rhinitis   . Elevated blood pressure reading without diagnosis of hypertension   . Family history of colon cancer    Uncle with col cancer, and father with adenomatous polyps in his 71s.  . Frequent headaches    secondary to chronic anxiety---CT scan brain normal in the past per pt report.  Marland Kitchen GAD (generalized anxiety disorder)   . GERD (gastroesophageal reflux disease)   . Obesity, Class I, BMI 30-34.9     Past Surgical History:  Procedure Laterality Date  .  HAIR TRANSPLANT  09/2015   [facility]    Family History  Problem Relation Age of Onset  . Hypertension Mother   . Arthritis Father   . Stroke Maternal Grandmother   . Diabetes Maternal Grandmother   . Stroke Maternal Grandfather   . Stroke Paternal Grandmother   . Aneurysm Paternal Grandmother   . Stroke Paternal Grandfather   . Pancreatic cancer Maternal Uncle     Social History   Socioeconomic History  . Marital status: Married    Spouse name: Not on file  . Number of children: Not on file  . Years of education: Not on file  . Highest education level: Not on file  Occupational History  . Not on file  Tobacco Use  . Smoking status: Never Smoker  . Smokeless tobacco: Never Used  Substance and Sexual Activity  . Alcohol use: No  . Drug use: No  . Sexual activity: Not on file  Other Topics Concern  . Not on file  Social History Narrative   Married, 2 daughters (as of 03/2018).   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   No T/A/Ds.   Social Determinants of Health   Financial Resource Strain:   . Difficulty of Paying Living Expenses: Not on file  Food Insecurity:   . Worried About Charity fundraiser in the Last Year: Not on file  . Ran Out of Food in the Last Year: Not on file  Transportation Needs:   . Film/video editor (Medical): Not on file  . Lack of Transportation (Non-Medical): Not on file  Physical Activity:   . Days of Exercise per Week: Not on file  . Minutes of Exercise per Session: Not on file  Stress:   . Feeling of Stress : Not on file  Social Connections:   . Frequency of Communication with Friends and Family: Not on file  . Frequency of Social Gatherings with Friends and Family: Not on file  . Attends Religious Services: Not on file  . Active Member of Clubs or Organizations: Not on file  . Attends Archivist Meetings: Not on file  . Marital Status: Not on file      Current Outpatient Medications:  .   cetirizine (ZYRTEC) 10 MG tablet, Take 10 mg by mouth daily., Disp: , Rfl:  .  esomeprazole (NEXIUM) 20 MG capsule, Take 20 mg by mouth daily at 12 noon., Disp: , Rfl:  .  finasteride (PROPECIA) 1 MG tablet, Take 1 tablet (1 mg total) by mouth daily., Disp: 90 tablet, Rfl: 3 .  LORazepam (ATIVAN) 0.5 MG tablet, 1 tab po bid prn moderate-to-severe anxiety, Disp: 180 tablet, Rfl: 1 .  cyclobenzaprine (FLEXERIL) 10 MG tablet, Take 1 tablet (10 mg total) by mouth at bedtime. (Patient not taking: Reported on 07/14/2019), Disp: 30 tablet, Rfl: 1 .  TRANSDERM-SCOP, 1.5 MG, 1 MG/3DAYS, PLACE 1 PATCH (1.5 MG TOTAL) ONTO THE SKIN EVERY 3 (THREE) DAYS. (Patient not taking: Reported on 03/10/2019), Disp: 10 patch, Rfl: 1  EXAM:  VITALS per patient if applicable: BP (!) 123456 (BP Location: Left Arm, Patient Position: Sitting, Cuff Size: Large)   Pulse 68   Wt 228 lb (103.4 kg)   BMI 33.67 kg/m    GENERAL: alert, oriented, appears well and in no acute distress  HEENT: atraumatic, conjunttiva clear, no obvious abnormalities on inspection of external nose and ears  NECK: normal movements of the head and neck  LUNGS: on inspection no signs of respiratory distress, breathing rate appears normal, no obvious gross SOB, gasping or wheezing  CV: no obvious cyanosis  MS: moves all visible extremities without noticeable abnormality  PSYCH/NEURO: pleasant and cooperative, no obvious depression or anxiety, speech and thought processing grossly intact  LABS: none today    Chemistry      Component Value Date/Time   NA 140 03/15/2018 1113   K 4.4 03/15/2018 1113   CL 101 03/15/2018 1113   CO2 33 (H) 03/15/2018 1113   BUN 16 03/15/2018 1113   CREATININE 0.97 03/15/2018 1113      Component Value Date/Time   CALCIUM 10.0 03/15/2018 1113   ALKPHOS 45 03/15/2018 1113   AST 21 03/15/2018 1113   ALT 37 03/15/2018 1113   BILITOT 0.6 03/15/2018 1113     Lab Results  Component Value Date   WBC 5.7  03/15/2018   HGB 16.3 03/15/2018   HCT 48.2 03/15/2018   MCV 89.0 03/15/2018   PLT 228.0 03/15/2018   Lab Results  Component Value Date   CHOL 149 03/15/2018   HDL 42.00 03/15/2018   LDLCALC 78 03/15/2018   TRIG 148.0 03/15/2018   CHOLHDL 4 03/15/2018   Lab Results  Component Value Date   TSH 2.20 03/15/2018   ASSESSMENT AND PLAN:  Discussed the following assessment and plan:  Essential HTN: new dx, time to start meds. Lisinpril 10mg  qd.  Therapeutic expectations and side effect profile of medication discussed today.  Patient's questions answered. Continue home bp monitoring.  Goal 130/80. CMET, FLP, TSH, CBC in 1 wk. F/u appt to review bp's and f/u bp med in 2-3 wks.   I discussed the assessment and treatment plan with the patient. The patient was provided an opportunity to ask questions and all were answered. The patient agreed with the plan and demonstrated an understanding of the instructions.   The patient was advised to call back or seek an in-person evaluation if the symptoms worsen or if the condition fails to improve as anticipated.  F/u: 1 wk fasting lab visit, 2-3 wk f/u HTN.  Signed:  Crissie Sickles, MD           07/14/2019

## 2019-08-05 ENCOUNTER — Other Ambulatory Visit: Payer: Self-pay | Admitting: Family Medicine

## 2019-08-07 DIAGNOSIS — Z20828 Contact with and (suspected) exposure to other viral communicable diseases: Secondary | ICD-10-CM | POA: Diagnosis not present

## 2019-08-07 DIAGNOSIS — Z03818 Encounter for observation for suspected exposure to other biological agents ruled out: Secondary | ICD-10-CM | POA: Diagnosis not present

## 2019-08-08 NOTE — Telephone Encounter (Signed)
LM for pt to schedule follow up

## 2019-08-12 DIAGNOSIS — H1013 Acute atopic conjunctivitis, bilateral: Secondary | ICD-10-CM | POA: Diagnosis not present

## 2019-08-13 ENCOUNTER — Other Ambulatory Visit: Payer: Self-pay | Admitting: Family Medicine

## 2019-08-13 ENCOUNTER — Other Ambulatory Visit: Payer: Self-pay

## 2019-08-13 ENCOUNTER — Ambulatory Visit (INDEPENDENT_AMBULATORY_CARE_PROVIDER_SITE_OTHER)
Admission: RE | Admit: 2019-08-13 | Discharge: 2019-08-13 | Disposition: A | Discharge status: Other Acute Inpatient Hospital | Payer: BC Managed Care – PPO | Source: Ambulatory Visit

## 2019-08-13 ENCOUNTER — Ambulatory Visit (HOSPITAL_COMMUNITY)
Admission: EM | Admit: 2019-08-13 | Discharge: 2019-08-13 | Disposition: A | Discharge status: Home / Self Care | Payer: BC Managed Care – PPO

## 2019-08-13 ENCOUNTER — Encounter (HOSPITAL_COMMUNITY): Payer: Self-pay

## 2019-08-13 DIAGNOSIS — H1033 Unspecified acute conjunctivitis, bilateral: Secondary | ICD-10-CM

## 2019-08-13 DIAGNOSIS — H5713 Ocular pain, bilateral: Secondary | ICD-10-CM

## 2019-08-13 DIAGNOSIS — H53143 Visual discomfort, bilateral: Secondary | ICD-10-CM

## 2019-08-13 DIAGNOSIS — H5789 Other specified disorders of eye and adnexa: Secondary | ICD-10-CM

## 2019-08-13 DIAGNOSIS — H1013 Acute atopic conjunctivitis, bilateral: Secondary | ICD-10-CM

## 2019-08-13 MED ORDER — TETRACAINE HCL 0.5 % OP SOLN
OPHTHALMIC | Status: AC
  Filled 2019-08-13: qty 4

## 2019-08-13 MED ORDER — FLUORESCEIN SODIUM 1 MG OP STRP
ORAL_STRIP | OPHTHALMIC | Status: AC
  Filled 2019-08-13: qty 1

## 2019-08-13 MED ORDER — TOBRAMYCIN-DEXAMETHASONE 0.3-0.1 % OP SUSP: 2.0000 [drp] | OPHTHALMIC | 0 refills | Status: DC

## 2019-08-13 NOTE — ED Triage Notes (Addendum)
Pt is here with eye irritation in both eyes since Wednesday. States he has seasonal allergies but states his eyes were leaking & dry. States his tears burning his face.

## 2019-08-13 NOTE — ED Notes (Signed)
Discharged by provider

## 2019-08-13 NOTE — Discharge Instructions (Signed)
I suspect that your symptoms started out with allergic conjunctivitis that was complicated by a bacterial conjunctivitis now.  I am prescribing you an eyedrop that contains both an antibiotic and a steroid.  Important to use this for the next 3 to 4 days but do not use beyond 4 days due to having the steroid.  I highly recommend that you set up a follow-up first thing Monday morning with your eye doctor but just in case I have also provided you with the information to Dr. Zenia Resides office.

## 2019-08-13 NOTE — ED Provider Notes (Addendum)
Virtual Visit via Video Note:  KI AKERMAN  initiated request for Telemedicine visit with [facility] Urgent [facility] team. I connected with Thea Alken  on 08/13/2019 at 10:35 AM  for a synchronized telemedicine visit using a video enabled HIPPA compliant telemedicine application. I verified that I am speaking with Thea Alken  using two identifiers. Jaynee Eagles, PA-C  was physically located in a [facility] Urgent care site and EARSEL BRADBURY was located at a different location.   The limitations of evaluation and management by telemedicine as well as the availability of in-person appointments were discussed. Patient was informed that he  may incur a bill ( including co-pay) for this virtual visit encounter. Thea Alken  expressed understanding and gave verbal consent to proceed with virtual visit.     History of Present Illness:Alec Downs  is a 34 y.o. male presents with 2 week hx of acute onset recurrent eye burning, oozing, watering, worse over the left eye but right eye is also bothering him. Has bilateral photophobia. Has really bad allergies, has been using Pataday eye drops. Does not wear contacts. Last night, felt like he had a stye of his eye.    ROS  Current Outpatient Medications  Medication Sig Dispense Refill  . cetirizine (ZYRTEC) 10 MG tablet Take 10 mg by mouth daily.    . cyclobenzaprine (FLEXERIL) 10 MG tablet Take 1 tablet (10 mg total) by mouth at bedtime. (Patient not taking: Reported on 07/14/2019) 30 tablet 1  . esomeprazole (NEXIUM) 20 MG capsule Take 20 mg by mouth daily at 12 noon.    . finasteride (PROPECIA) 1 MG tablet Take 1 tablet (1 mg total) by mouth daily. 90 tablet 3  . lisinopril (ZESTRIL) 10 MG tablet TAKE 1 TABLET BY MOUTH EVERY DAY 30 tablet 0  . LORazepam (ATIVAN) 0.5 MG tablet 1 tab po bid prn moderate-to-severe anxiety 180 tablet 1  . TRANSDERM-SCOP, 1.5 MG, 1 MG/3DAYS PLACE 1 PATCH (1.5 MG TOTAL) ONTO THE  SKIN EVERY 3 (THREE) DAYS. (Patient not taking: Reported on 03/10/2019) 10 patch 1   No current facility-administered medications for this encounter.     Allergies  Allergen Reactions  . Duloxetine Other (See Comments)    Shaky, paranoid       Past Medical History:  Diagnosis Date  . Allergic rhinitis   . Elevated blood pressure reading without diagnosis of hypertension   . Family history of colon cancer    Uncle with col cancer, and father with adenomatous polyps in his 82s.  . Frequent headaches    secondary to chronic anxiety---CT scan brain normal in the past per pt report.  Marland Kitchen GAD (generalized anxiety disorder)   . GERD (gastroesophageal reflux disease)   . Obesity, Class I, BMI 30-34.9     Past Surgical History:  Procedure Laterality Date  . HAIR TRANSPLANT  09/2015   [facility]      Observations/Objective: Physical Exam Constitutional:      General: He is not in acute distress.    Appearance: Normal appearance. He is not ill-appearing, toxic-appearing or diaphoretic.  HENT:     Head: Atraumatic.  Eyes:     General: No scleral icterus.       Right eye: Discharge (watery) present.        Left eye: Discharge (watery) present.    Extraocular Movements: Extraocular movements intact.     Comments: Puffy eyelids bilaterally.  Pulmonary:     Effort: Pulmonary  effort is normal.  Neurological:     Mental Status: He is alert and oriented to person, place, and time.  Psychiatric:        Mood and Affect: Mood normal.        Behavior: Behavior normal.        Thought Content: Thought content normal.        Judgment: Judgment normal.      Assessment and Plan:  1. Allergic conjunctivitis of both eyes   2. Eye pain, bilateral   3. Redness of both eyes   4. Photophobia of both eyes    Recommended an in person office visit for eye exam especially given photophobia.  Patient is agreeable to this, will come into our [place] site.   Follow Up Instructions:    I  discussed the assessment and treatment plan with the patient. The patient was provided an opportunity to ask questions and all were answered. The patient agreed with the plan and demonstrated an understanding of the instructions.   The patient was advised to call back or seek an in-person evaluation if the symptoms worsen or if the condition fails to improve as anticipated.  I provided 10 minutes of non-face-to-face time during this encounter.    Jaynee Eagles, PA-C  08/13/2019 10:35 AM        Jaynee Eagles, PA-C 08/13/19 1043

## 2019-08-13 NOTE — ED Provider Notes (Signed)
[place]   MRN: [id number] DOB: [birth year]  Subjective:   Alec Downs is a 34 y.o. male presenting for 2 week hx of acute onset recurrent eye burning, oozing, watering, worse over the left eye but right eye is also bothering him. Has bilateral photophobia. Has really bad allergies, has been using Pataday eye drops. Does not wear contacts. Last night, felt like he had a stye of his eye.   No current facility-administered medications for this encounter.  Current Outpatient Medications:  .  cetirizine (ZYRTEC) 10 MG tablet, Take 10 mg by mouth daily., Disp: , Rfl:  .  cromolyn (OPTICROM) 4 % ophthalmic solution, , Disp: , Rfl:  .  esomeprazole (NEXIUM) 20 MG capsule, Take 20 mg by mouth daily at 12 noon., Disp: , Rfl:  .  finasteride (PROPECIA) 1 MG tablet, Take 1 tablet (1 mg total) by mouth daily., Disp: 90 tablet, Rfl: 3 .  lisinopril (ZESTRIL) 10 MG tablet, TAKE 1 TABLET BY MOUTH EVERY DAY, Disp: 30 tablet, Rfl: 0 .  LORazepam (ATIVAN) 0.5 MG tablet, 1 tab po bid prn moderate-to-severe anxiety, Disp: 180 tablet, Rfl: 1   Allergies  Allergen Reactions  . Duloxetine Other (See Comments)    Shaky, paranoid      Past Medical History:  Diagnosis Date  . Allergic rhinitis   . Elevated blood pressure reading without diagnosis of hypertension   . Family history of colon cancer    Uncle with col cancer, and father with adenomatous polyps in his 62s.  . Frequent headaches    secondary to chronic anxiety---CT scan brain normal in the past per pt report.  Marland Kitchen GAD (generalized anxiety disorder)   . GERD (gastroesophageal reflux disease)   . Obesity, Class I, BMI 30-34.9      Past Surgical History:  Procedure Laterality Date  . HAIR TRANSPLANT  09/2015   [facility]    Family History  Problem Relation Age of Onset  . Hypertension Mother   . Arthritis Father   . Stroke Maternal Grandmother   . Diabetes Maternal Grandmother   . Stroke Maternal Grandfather   . Stroke  Paternal Grandmother   . Aneurysm Paternal Grandmother   . Stroke Paternal Grandfather   . Pancreatic cancer Maternal Uncle     Social History   Tobacco Use  . Smoking status: Never Smoker  . Smokeless tobacco: Never Used  Substance Use Topics  . Alcohol use: No  . Drug use: No    ROS   Objective:   Vitals: BP (!) 144/79 (BP Location: Right Arm)   Pulse 82   Temp 98 F (36.7 C) (Oral)   Resp 18   Wt 228 lb (103.4 kg)   SpO2 98%   BMI 33.67 kg/m     Visual Acuity  Right Eye Distance: 20/40 Left Eye Distance: 20/30 Bilateral Distance: 20/30  Right Eye Near:   Left Eye Near:    Bilateral Near:     Physical Exam Constitutional:      General: He is not in acute distress.    Appearance: Normal appearance. He is well-developed and normal weight. He is not ill-appearing, toxic-appearing or diaphoretic.  HENT:     Head: Normocephalic and atraumatic.     Right Ear: External ear normal.     Left Ear: External ear normal.     Nose: Nose normal.     Mouth/Throat:     Pharynx: Oropharynx is clear.  Eyes:     General: Lids  are everted, no foreign bodies appreciated. Vision grossly intact. No scleral icterus.       Right eye: Discharge (clear) present. No foreign body or hordeolum.        Left eye: Discharge (clear) present.No foreign body or hordeolum.     Extraocular Movements: Extraocular movements intact.     Conjunctiva/sclera:     Right eye: Right conjunctiva is injected. No chemosis, exudate or hemorrhage.    Left eye: Left conjunctiva is injected. No chemosis, exudate or hemorrhage.    Pupils: Pupils are equal, round, and reactive to light.     Comments: Bilateral trace eyelid swelling.  Cardiovascular:     Rate and Rhythm: Normal rate.  Pulmonary:     Effort: Pulmonary effort is normal.  Musculoskeletal:     Cervical back: Normal range of motion.  Neurological:     Mental Status: He is alert and oriented to person, place, and time.  Psychiatric:         Mood and Affect: Mood normal.        Behavior: Behavior normal.        Thought Content: Thought content normal.        Judgment: Judgment normal.     Assessment and Plan :   1. Acute conjunctivitis of both eyes, unspecified acute conjunctivitis type   2. Redness of both eyes   3. Photophobia of both eyes     Case discussed with Dr. Joseph Art. Will use tobradex eye drops to address concurrent allergic and bacterial conjunctivitis. F/u with Dr. Zenia Resides office Monday or Tuesday. Counseled patient on potential for adverse effects with medications prescribed/recommended today, ER and return-to-clinic precautions discussed, patient verbalized understanding.    Jaynee Eagles, PA-C 08/13/19 1520

## 2019-08-21 ENCOUNTER — Ambulatory Visit (INDEPENDENT_AMBULATORY_CARE_PROVIDER_SITE_OTHER)
Admission: RE | Admit: 2019-08-21 | Discharge: 2019-08-21 | Disposition: A | Discharge status: Other Acute Inpatient Hospital | Payer: BC Managed Care – PPO | Source: Ambulatory Visit

## 2019-08-21 DIAGNOSIS — R0602 Shortness of breath: Secondary | ICD-10-CM | POA: Diagnosis not present

## 2019-08-21 DIAGNOSIS — R05 Cough: Secondary | ICD-10-CM

## 2019-08-21 DIAGNOSIS — R059 Cough, unspecified: Secondary | ICD-10-CM

## 2019-08-21 NOTE — Discharge Instructions (Addendum)
Come to the Urgent Care for in-person evaluation of your symptoms.     

## 2019-08-21 NOTE — ED Provider Notes (Signed)
Virtual Visit via Video Note:  Alec Downs  initiated request for Telemedicine visit with [facility] Urgent Care team. I connected with Alec Downs  on 08/21/2019 at 8:49 AM  for a synchronized telemedicine visit using a video enabled HIPPA compliant telemedicine application. I verified that I am speaking with Alec Downs  using two identifiers. Sharion Balloon, NP  was physically located in a [facility] Urgent care site and Alec Downs was located at a different location.   The limitations of evaluation and management by telemedicine as well as the availability of in-person appointments were discussed. Patient was informed that he  may incur a bill ( including co-pay) for this virtual visit encounter. Alec Downs  expressed understanding and gave verbal consent to proceed with virtual visit.     History of Present Illness:Alec Downs  is a 34 y.o. male presents for evaluation of "tightness" in chest yesterday which resolved with "throwing up mucous yesterday evening"  The phlegm was was blood-streaked.  Today he still feels "tight" in his chest, occasional cough productive of thick clear phlegm with blood streaks, shortness of breath, and "heart racing".  He denies fever, chills, sore throat, diarrhea, rash, or other symptoms.   He states he had the [place] first dose on 08/18/2019.     Allergies  Allergen Reactions  . Duloxetine Other (See Comments)    Shaky, paranoid       Past Medical History:  Diagnosis Date  . Allergic rhinitis   . Elevated blood pressure reading without diagnosis of hypertension   . Family history of colon cancer    Uncle with col cancer, and father with adenomatous polyps in his 36s.  . Frequent headaches    secondary to chronic anxiety---CT scan brain normal in the past per pt report.  Marland Kitchen GAD (generalized anxiety disorder)   . GERD (gastroesophageal reflux disease)   . Obesity, Class I, BMI 30-34.9      Social  History   Tobacco Use  . Smoking status: Never Smoker  . Smokeless tobacco: Never Used  Substance Use Topics  . Alcohol use: No  . Drug use: No    ROS: as stated in HPI.  All other systems reviewed and negative.      Observations/Objective: Physical Exam  VITALS: Patient denies fever. GENERAL: Alert, appears well and in no acute distress. HEENT: Atraumatic. NECK: Normal movements of the head and neck. CARDIOPULMONARY: No increased WOB. Speaking in clear sentences. I:E ratio WNL.  MS: Moves all visible extremities without noticeable abnormality. PSYCH: Pleasant and cooperative, well-groomed. Speech normal rate and rhythm. Affect is appropriate. Insight and judgement are appropriate. Attention is focused, linear, and appropriate.  NEURO: CN grossly intact. Oriented as arrived to appointment on time with no prompting. Moves both UE equally.  SKIN: No obvious lesions, wounds, erythema, or cyanosis noted on face or hands.   Assessment and Plan:    ICD-10-CM   1. Cough  R05   2. Shortness of breath  R06.02        Follow Up Instructions: Discussed with patient that he needs in person evaluation and instructed him to come to the Urgent Care.  Patient agrees to plan of care.      I discussed the assessment and treatment plan with the patient. The patient was provided an opportunity to ask questions and all were answered. The patient agreed with the plan and demonstrated an understanding of the instructions.   The  patient was advised to call back or seek an in-person evaluation if the symptoms worsen or if the condition fails to improve as anticipated.      Sharion Balloon, NP  08/21/2019 8:49 AM         Sharion Balloon, NP 08/21/19 [phone]

## 2019-08-23 ENCOUNTER — Ambulatory Visit (INDEPENDENT_AMBULATORY_CARE_PROVIDER_SITE_OTHER): Payer: BC Managed Care – PPO | Admitting: Family Medicine

## 2019-08-23 ENCOUNTER — Encounter: Payer: Self-pay | Admitting: Gastroenterology

## 2019-08-23 ENCOUNTER — Encounter: Payer: Self-pay | Admitting: Family Medicine

## 2019-08-23 ENCOUNTER — Other Ambulatory Visit: Payer: Self-pay

## 2019-08-23 VITALS — BP 137/72 | HR 82

## 2019-08-23 DIAGNOSIS — K2101 Gastro-esophageal reflux disease with esophagitis, with bleeding: Secondary | ICD-10-CM

## 2019-08-23 DIAGNOSIS — I10 Essential (primary) hypertension: Secondary | ICD-10-CM

## 2019-08-23 DIAGNOSIS — L820 Inflamed seborrheic keratosis: Secondary | ICD-10-CM | POA: Diagnosis not present

## 2019-08-23 DIAGNOSIS — D2239 Melanocytic nevi of other parts of face: Secondary | ICD-10-CM | POA: Diagnosis not present

## 2019-08-23 DIAGNOSIS — K222 Esophageal obstruction: Secondary | ICD-10-CM | POA: Diagnosis not present

## 2019-08-23 DIAGNOSIS — D225 Melanocytic nevi of trunk: Secondary | ICD-10-CM | POA: Diagnosis not present

## 2019-08-23 DIAGNOSIS — D485 Neoplasm of uncertain behavior of skin: Secondary | ICD-10-CM | POA: Diagnosis not present

## 2019-08-23 DIAGNOSIS — L578 Other skin changes due to chronic exposure to nonionizing radiation: Secondary | ICD-10-CM | POA: Diagnosis not present

## 2019-08-23 MED ORDER — PANTOPRAZOLE SODIUM 40 MG PO TBEC: 40.0000 mg | DELAYED_RELEASE_TABLET | Freq: Two times a day (BID) | ORAL | 1 refills | Status: DC

## 2019-08-23 NOTE — Progress Notes (Signed)
Virtual Visit via Video Note  I connected with Alec Downs on 08/23/19 at  2:30 PM EDT by a video enabled telemedicine application and verified that I am speaking with the correct person using two identifiers.  Location patient: home Location provider:work or home office Persons participating in the virtual visit: patient, provider  I discussed the limitations of evaluation and management by telemedicine and the availability of in person appointments. The patient expressed understanding and agreed to proceed.  Telemedicine visit is a necessity given the COVID-19 restrictions in place at the current time.  HPI: 34 y/o WM being seen today for "chest pain and bloody mucous". A/P as of last visit: "Essential HTN: new dx, time to start meds. Lisinpril 10mg  qd.  Therapeutic expectations and side effect profile of medication discussed today.  Patient's questions answered. Continue home bp monitoring.  Goal 130/80. CMET, FLP, TSH, CBC in 1 wk. F/u appt to review bp's and f/u bp med in 2-3 wks."  Since his last visit with me 6 wks ago he has gone to the ED 2 times.  Once for allergic conjunctivitis on 08/13/19 and once for cough on 08/21/19 (2 d/a). Reviewed ED records today: the 08/21/19 visit was an ED telemedicine visit (Urgent Care) for CC of tightness in chest and "throwing up mucous", says phlegm was blood streaked.  Also endorsed SOB and heart racing.  No fevers.  He had first inj of covid vaccine 08/18/19.  Assessment and Plan simply said Cough and SOB, but nothing mentioned about testing or med rx's.  telemed visit so no imaging or labs done at that time.   Interim hx: Here is the history I obtained from him today: Has long hx of allergic rhinitis, recent probs with allergic conjunctivitis.  Some drops were given and this is gone. Says that about 1 mo ago (after starting lisinopril) he started getting pressure in chest. He has long hx of GERD, has been having significant DAILY dysphagia recently,  sometimes has to regurgitate food and this has some mucous/stomach acid that had very fine streaks of blood in it. He does have signif postprandial GER sx's.  Flat on back in bed is the worst.  No nasal congestion but some chronic runny nose.   He gets nausea later in the day w/out vomiting.  No constipa or diarrhea. NO ST, NO cough.  No facial pain.   He takes nexium 20mg  qd only.  ROS: See pertinent positives and negatives per HPI.  Past Medical History:  Diagnosis Date  . Allergic rhinitis   . Elevated blood pressure reading without diagnosis of hypertension   . Family history of colon cancer    Uncle with col cancer, and father with adenomatous polyps in his 36s.  . Frequent headaches    secondary to chronic anxiety---CT scan brain normal in the past per Alec Downs report.  Marland Kitchen GAD (generalized anxiety disorder)   . GERD (gastroesophageal reflux disease)   . Obesity, Class I, BMI 30-34.9     Past Surgical History:  Procedure Laterality Date  . HAIR TRANSPLANT  09/2015   [facility]    Family History  Problem Relation Age of Onset  . Hypertension Mother   . Arthritis Father   . Stroke Maternal Grandmother   . Diabetes Maternal Grandmother   . Stroke Maternal Grandfather   . Stroke Paternal Grandmother   . Aneurysm Paternal Grandmother   . Stroke Paternal Grandfather   . Pancreatic cancer Maternal Uncle     Social History  Socioeconomic History  . Marital status: Married    Spouse name: Not on file  . Number of children: Not on file  . Years of education: Not on file  . Highest education level: Not on file  Occupational History  . Not on file  Tobacco Use  . Smoking status: Never Smoker  . Smokeless tobacco: Never Used  Substance and Sexual Activity  . Alcohol use: No  . Drug use: No  . Sexual activity: Yes    Birth control/protection: None    Comment: married  Other Topics Concern  . Not on file  Social History Narrative   Married, 2 daughters (as of 03/2018).    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   No T/A/Ds.   Social Determinants of Health   Financial Resource Strain:   . Difficulty of Paying Living Expenses:   Food Insecurity:   . Worried About Charity fundraiser in the Last Year:   . Arboriculturist in the Last Year:   Transportation Needs:   . Film/video editor (Medical):   Marland Kitchen Lack of Transportation (Non-Medical):   Physical Activity:   . Days of Exercise per Week:   . Minutes of Exercise per Session:   Stress:   . Feeling of Stress :   Social Connections:   . Frequency of Communication with Friends and Family:   . Frequency of Social Gatherings with Friends and Family:   . Attends Religious Services:   . Active Member of Clubs or Organizations:   . Attends Archivist Meetings:   Marland Kitchen Marital Status:       Current Outpatient Medications:  .  cetirizine (ZYRTEC) 10 MG tablet, Take 10 mg by mouth daily., Disp: , Rfl:  .  esomeprazole (NEXIUM) 20 MG capsule, Take 20 mg by mouth daily at 12 noon., Disp: , Rfl:  .  finasteride (PROPECIA) 1 MG tablet, Take 1 tablet (1 mg total) by mouth daily., Disp: 90 tablet, Rfl: 3 .  LORazepam (ATIVAN) 0.5 MG tablet, 1 tab po bid prn moderate-to-severe anxiety, Disp: 180 tablet, Rfl: 1 .  lisinopril (ZESTRIL) 10 MG tablet, TAKE 1 TABLET BY MOUTH EVERY DAY (Patient not taking: Reported on 08/23/2019), Disp: 30 tablet, Rfl: 0  EXAM:  VITALS per patient if applicable: BP [id number] (BP Location: Left Arm, Patient Position: Sitting, Cuff Size: Large)   Pulse 82    GENERAL: alert, oriented, appears well and in no acute distress  HEENT: atraumatic, conjunttiva clear, no obvious abnormalities on inspection of external nose and ears  NECK: normal movements of the head and neck  LUNGS: on inspection no signs of respiratory distress, breathing rate appears normal, no obvious gross SOB, gasping or wheezing  CV: no obvious cyanosis  MS: moves all visible extremities  without noticeable abnormality  PSYCH/NEURO: pleasant and cooperative, no obvious depression or anxiety, speech and thought processing grossly intact  LABS: none today    Chemistry      Component Value Date/Time   NA 140 03/15/2018 1113   K 4.4 03/15/2018 1113   CL 101 03/15/2018 1113   CO2 33 (H) 03/15/2018 1113   BUN 16 03/15/2018 1113   CREATININE 0.97 03/15/2018 1113      Component Value Date/Time   CALCIUM 10.0 03/15/2018 1113   ALKPHOS 45 03/15/2018 1113   AST 21 03/15/2018 1113   ALT 37 03/15/2018 1113   BILITOT 0.6 03/15/2018 1113     Lab Results  Component Value Date   WBC 5.7 03/15/2018   HGB 16.3 03/15/2018   HCT 48.2 03/15/2018   MCV 89.0 03/15/2018   PLT 228.0 03/15/2018    ASSESSMENT AND PLAN:  Discussed the following assessment and plan:  1) GERD with esophagitis and esoph stenosis. D/c nexium. Start pantoprazole 40mg  bid.  GERD diet as able. Refer to GI for further evaluation.  2) HTN: intolerant of lisinopril. Off bp med now for a month or more he has had avg of 135/80 or so and feels better. No bp meds at this time.  Continue to do home bp and hr monitoring. Continue efforts at [facility].   I discussed the assessment and treatment plan with the patient. The patient was provided an opportunity to ask questions and all were answered. The patient agreed with the plan and demonstrated an understanding of the instructions.   The patient was advised to call back or seek an in-person evaluation if the symptoms worsen or if the condition fails to improve as anticipated.  F/u: 6-8 wks f/u HTN and esophagitis  Signed:  Crissie Sickles, MD           08/23/2019

## 2019-09-05 ENCOUNTER — Ambulatory Visit: Payer: BC Managed Care – PPO | Admitting: Gastroenterology

## 2019-09-05 ENCOUNTER — Encounter: Payer: Self-pay | Admitting: Gastroenterology

## 2019-09-05 VITALS — BP 142/94 | HR 76 | Temp 97.9°F | Ht 69.0 in | Wt 230.0 lb

## 2019-09-05 DIAGNOSIS — R1310 Dysphagia, unspecified: Secondary | ICD-10-CM

## 2019-09-05 DIAGNOSIS — R0789 Other chest pain: Secondary | ICD-10-CM

## 2019-09-05 DIAGNOSIS — K219 Gastro-esophageal reflux disease without esophagitis: Secondary | ICD-10-CM

## 2019-09-05 DIAGNOSIS — R194 Change in bowel habit: Secondary | ICD-10-CM

## 2019-09-05 DIAGNOSIS — K920 Hematemesis: Secondary | ICD-10-CM

## 2019-09-05 MED ORDER — PANTOPRAZOLE SODIUM 40 MG PO TBEC: 40.0000 mg | DELAYED_RELEASE_TABLET | Freq: Two times a day (BID) | ORAL | 5 refills | Status: DC

## 2019-09-05 MED ORDER — NA SULFATE-K SULFATE-MG SULF 17.5-3.13-1.6 GM/177ML PO SOLN: 1.0000 | Freq: Once | ORAL | 0 refills | Status: AC

## 2019-09-05 NOTE — Progress Notes (Signed)
Reviewed and agree with management plans. ? ?Dinita Migliaccio L. Chetara Kropp, MD, MPH  ?

## 2019-09-05 NOTE — Patient Instructions (Signed)
If you are age 35 or older, your body mass index should be between 23-30. Your Body mass index is 33.97 kg/m. If this is out of the aforementioned range listed, please consider follow up with your Primary Care Provider.  If you are age 73 or younger, your body mass index should be between 19-25. Your Body mass index is 33.97 kg/m. If this is out of the aformentioned range listed, please consider follow up with your Primary Care Provider.   You have been scheduled for an endoscopy and colonoscopy. Please follow the written instructions given to you at your visit today. Please pick up your prep supplies at the pharmacy within the next 1-3 days. If you use inhalers (even only as needed), please bring them with you on the day of your procedure.  Continue taking Pantoprazole 40 mg twice daily. Refills have been sent to your pharmacy.  START using Benefiber 2 table spoons in 16 ounces of liquid daily.

## 2019-09-05 NOTE — Progress Notes (Signed)
09/05/2019 ETON SENTZ [id number] 1985/10/05   HISTORY OF PRESENT ILLNESS:  This is a 34 year old male who is new to our office, referred here by Dr. Anitra Lauth for evaluation regarding acid reflux related issues and change in bowel habits.  He tells me that he's had acid reflux related issues since he was about 34 years old.  Says that he was on Nexium forever.  Was taking 20 mg daily until just recently when he saw his PCP and it was changed/increased to pantoprazole 40 mg BID.  He says that he had an esophagram years ago, but never had EGD in the past.  Reports waking up with a bitter taste in his mouth that makes him want to vomit.  Has vomited on occasion and sees very small streaks of blood at times.  Has been getting choked up when he eats or drinks for the past 6 months.  Feels like food is sitting in his chest.    Tells me that he was also told that he has IBS.  Reports that his bowels have been alternating between constipation and loose stools.  Says that he feels bloated all the time.  Denies seeing blood in his stool.  Has actually gained about 18 pounds over the past 6 months.  He says that his father had a bunch of polyps removed recently when he had EGD and colonoscopy.    Says that his quality of life is being affected.  PCP ordered labs but he has not had those drawn yet.   Past Medical History:  Diagnosis Date  . Allergic rhinitis   . Elevated blood pressure reading without diagnosis of hypertension   . Family history of colon cancer    Uncle with col cancer, and father with adenomatous polyps in his 15s.  . Frequent headaches    secondary to chronic anxiety---CT scan brain normal in the past per pt report.  Marland Kitchen GAD (generalized anxiety disorder)   . GERD (gastroesophageal reflux disease)   . Irritable bowel syndrome   . Obesity, Class I, BMI 30-34.9    Past Surgical History:  Procedure Laterality Date  . CLAVICLE SURGERY Left    bone spurs removed  . HAIR  TRANSPLANT  09/2015   [facility]  . labrum repair Left     reports that he has never smoked. He has never used smokeless tobacco. He reports current alcohol use. He reports that he does not use drugs. family history includes Aneurysm in his paternal grandmother; Arthritis in his father; Colon cancer in his paternal grandfather; Colon polyps in his father; Diabetes in his maternal aunt, maternal grandmother, and maternal uncle; Hypertension in his mother; Irritable bowel syndrome in his father; Other in his father; Pancreatic cancer in his maternal uncle; Stroke in his maternal grandfather, maternal grandmother, paternal grandfather, and paternal grandmother. Allergies  Allergen Reactions  . Duloxetine Other (See Comments)    Shaky, paranoid        Outpatient Encounter Medications as of 09/05/2019  Medication Sig  . cetirizine (ZYRTEC) 10 MG tablet Take 10 mg by mouth daily.  . finasteride (PROPECIA) 1 MG tablet Take 1 tablet (1 mg total) by mouth daily.  Marland Kitchen LORazepam (ATIVAN) 0.5 MG tablet 1 tab po bid prn moderate-to-severe anxiety  . pantoprazole (PROTONIX) 40 MG tablet Take 1 tablet (40 mg total) by mouth 2 (two) times daily.  . [DISCONTINUED] esomeprazole (NEXIUM) 20 MG capsule Take 20 mg by mouth daily at 12 noon.  No facility-administered encounter medications on file as of 09/05/2019.     REVIEW OF SYSTEMS  : All other systems reviewed and negative except where noted in the History of Present Illness.   PHYSICAL EXAM: Temp 97.9 F (36.6 C)   Ht 5\' 9"  (1.753 m)   Wt 230 lb (104.3 kg)   BMI 33.97 kg/m  General: Well developed white male in no acute distress Head: Normocephalic and atraumatic Eyes:  Sclerae anicteric, conjunctiva pink. Ears: Normal auditory acuity Lungs: Clear throughout to auscultation; no increased WOB. Heart: Regular rate and rhythm; no M/R/G. Abdomen: Soft, non-distended.  BS present.  Mild diffuse TTP. Rectal:  Will be done at the time of colonoscopy.  Musculoskeletal: Symmetrical with no gross deformities  Skin: No lesions on visible extremities Extremities: No edema  Neurological: Alert oriented x 4, grossly non-focal Psychological:  Alert and cooperative. Normal mood and affect  ASSESSMENT AND PLAN: *GERD, dysphagia, atypical chest pain, hematemesis:  Long-standing acid reflux since he was 34 years old.  Reports having esophagram in the past but no EGD.  PPI recently changed/increased from Nexium 20 mg daily to pantoprazole 40 mg BID, which he will continue for now.  Prescription refill will be sent.  Will plan for EGD with Dr. Tarri Glenn. *Altered bowel habits:  Has been alternating between constipation and loose stools.  Will try daily powder fiber supplement in the form of Benefiber daily.  Will plan for colonoscopy as well.  **The risks, benefits, and alternatives to EGD and colonoscopy were discussed with the patient and he consents to proceed.   **Looks like there are a bunch of labs entered from Dr. Anitra Lauth that he is supposed to have drawn including CBC, CMP, TSH, and lipid panel.  CC:  McGowen, Adrian Blackwater, MD

## 2019-09-13 ENCOUNTER — Encounter: Payer: Self-pay | Admitting: Family Medicine

## 2019-09-30 DIAGNOSIS — N451 Epididymitis: Secondary | ICD-10-CM

## 2019-09-30 HISTORY — DX: Epididymitis: N45.1

## 2019-10-04 ENCOUNTER — Ambulatory Visit: Payer: BC Managed Care – PPO | Admitting: Family Medicine

## 2019-10-04 DIAGNOSIS — Z0289 Encounter for other administrative examinations: Secondary | ICD-10-CM

## 2019-10-04 NOTE — Progress Notes (Deleted)
OFFICE VISIT  10/04/2019   CC: No chief complaint on file.    HPI:    Patient is a 34 y.o. Caucasian male who presents for f/u HTN. He was intolerant of lisinopril. BP at home had been remaining near 130/80 after pt's self- d/c of lisinopril so at last visit we decided not to start any new med and just monitor bp regularly at home and review these today. Since I last saw him he saw GI provider 09/05/19 to eval ongoing severe GERD with dysphagia. He was continued on PPI bid at that time and the plan was for him to get EGD.  Also, has long hx of IBS-type sx's so colonoscopy was planned as well. Neither of these procedures has been done yet. I ordered CBC, CMET, TSH, and FLP back in Feb this year but he has not gotten these done yet.  INTERIM HX: ***  Past Medical History:  Diagnosis Date  . Allergic rhinitis   . Dysplastic nevi    back,thigh,chest (07/2019)  . Elevated blood pressure reading without diagnosis of hypertension   . Family history of colon cancer    Uncle with col cancer, and father with adenomatous polyps in his 76s.  . Frequent headaches    secondary to chronic anxiety---CT scan brain normal in the past per pt report.  Marland Kitchen GAD (generalized anxiety disorder)   . GERD (gastroesophageal reflux disease)   . Irritable bowel syndrome   . Obesity, Class I, BMI 30-34.9     Past Surgical History:  Procedure Laterality Date  . CLAVICLE SURGERY Left    bone spurs removed  . HAIR TRANSPLANT  09/2015   [facility]  . labrum repair Left     Outpatient Medications Prior to Visit  Medication Sig Dispense Refill  . cetirizine (ZYRTEC) 10 MG tablet Take 10 mg by mouth daily.    . finasteride (PROPECIA) 1 MG tablet Take 1 tablet (1 mg total) by mouth daily. 90 tablet 3  . LORazepam (ATIVAN) 0.5 MG tablet 1 tab po bid prn moderate-to-severe anxiety 180 tablet 1  . pantoprazole (PROTONIX) 40 MG tablet Take 1 tablet (40 mg total) by mouth 2 (two) times daily. 60 tablet 5   No  facility-administered medications prior to visit.    Allergies  Allergen Reactions  . Duloxetine Other (See Comments)    Shaky, paranoid      ROS As per HPI  PE: There were no vitals taken for this visit. ***  LABS:  Lab Results  Component Value Date   TSH 2.20 03/15/2018   Lab Results  Component Value Date   WBC 5.7 03/15/2018   HGB 16.3 03/15/2018   HCT 48.2 03/15/2018   MCV 89.0 03/15/2018   PLT 228.0 03/15/2018   Lab Results  Component Value Date   CREATININE 0.97 03/15/2018   BUN 16 03/15/2018   NA 140 03/15/2018   K 4.4 03/15/2018   CL 101 03/15/2018   CO2 33 (H) 03/15/2018   Lab Results  Component Value Date   ALT 37 03/15/2018   AST 21 03/15/2018   ALKPHOS 45 03/15/2018   BILITOT 0.6 03/15/2018   Lab Results  Component Value Date   CHOL 149 03/15/2018   Lab Results  Component Value Date   HDL 42.00 03/15/2018   Lab Results  Component Value Date   LDLCALC 78 03/15/2018   Lab Results  Component Value Date   TRIG 148.0 03/15/2018   Lab Results  Component Value Date  CHOLHDL 4 03/15/2018   IMPRESSION AND PLAN:  No problem-specific Assessment & Plan notes found for this encounter.  Release lab orders.  An After Visit Summary was printed and given to the patient.  FOLLOW UP: No follow-ups on file.  Signed:  Crissie Sickles, MD           10/04/2019

## 2019-10-09 ENCOUNTER — Telehealth: Payer: Self-pay | Admitting: Gastroenterology

## 2019-10-09 NOTE — Telephone Encounter (Signed)
Hey Dr. Tarri Glenn- this patient had a endo/colon scheduled for tomorrow 5/11 and he has rescheduled for 6/3. States that both of hes kids have came down with a stomach virus and he does not feel well either.

## 2019-10-10 ENCOUNTER — Encounter: Payer: BC Managed Care – PPO | Admitting: Gastroenterology

## 2019-10-15 ENCOUNTER — Other Ambulatory Visit: Payer: Self-pay | Admitting: Family Medicine

## 2019-10-15 ENCOUNTER — Ambulatory Visit (INDEPENDENT_AMBULATORY_CARE_PROVIDER_SITE_OTHER)
Admission: RE | Admit: 2019-10-15 | Discharge: 2019-10-15 | Disposition: A | Discharge status: Home / Self Care | Payer: BC Managed Care – PPO | Source: Ambulatory Visit

## 2019-10-15 DIAGNOSIS — J3489 Other specified disorders of nose and nasal sinuses: Secondary | ICD-10-CM

## 2019-10-15 DIAGNOSIS — R05 Cough: Secondary | ICD-10-CM

## 2019-10-15 DIAGNOSIS — R0981 Nasal congestion: Secondary | ICD-10-CM

## 2019-10-15 DIAGNOSIS — R059 Cough, unspecified: Secondary | ICD-10-CM

## 2019-10-15 MED ORDER — IPRATROPIUM BROMIDE 0.06 % NA SOLN: 2.0000 | Freq: Four times a day (QID) | NASAL | 0 refills | Status: DC

## 2019-10-15 MED ORDER — ALBUTEROL SULFATE HFA 108 (90 BASE) MCG/ACT IN AERS: 1.0000 | INHALATION_SPRAY | Freq: Four times a day (QID) | RESPIRATORY_TRACT | 0 refills | Status: DC | PRN

## 2019-10-15 MED ORDER — PREDNISONE 50 MG PO TABS: 50.0000 mg | ORAL_TABLET | Freq: Every day | ORAL | 0 refills | Status: DC

## 2019-10-15 NOTE — Discharge Instructions (Addendum)
Start prednisone as directed for congestion/cough. Atrovent nasal spray for nasal drainage. Albuterol as needed for shortness of breath/wheezing. Monitor for any worsening of symptoms, chest pain, worsening shortness of breath, wheezing, swelling of the throat, go to the emergency department for further evaluation needed. Otherwise, follow up in person if symptoms not improving.  [facility] at [facility]) [place], [place], [place] [address] [phone]  [facility] at [facility] [address] [place], [place] [address] [phone]  [facility] at [facility] [address] [place], [place] [address] [phone]  [facility] at [place] [address], [place], [place], [place] [address] [phone]  [facility] at [place] [address] [place], [place] [address] [phone]  [facility] at [address], [place], [place] [address] [phone]

## 2019-10-15 NOTE — ED Provider Notes (Signed)
Virtual Visit via Video Note:  Alec Downs  initiated request for Telemedicine visit with [facility] team. I connected with Alec Downs  on 10/15/2019 at 11:36 AM  for a synchronized telemedicine visit using a video enabled HIPPA compliant telemedicine application. I verified that I am speaking with Alec Downs  using two identifiers. Alec Borgwardt Jodell Cipro, PA-C  was physically located in a [facility] Urgent care site and Alec Downs was located at a different location.   The limitations of evaluation and management by telemedicine as well as the availability of in-person appointments were discussed. Patient was informed that he  may incur a bill ( including co-pay) for this virtual visit encounter. Alec Downs  expressed understanding and gave verbal consent to proceed with virtual visit.     History of Present Illness:Alec Downs  is a 34 y.o. male presents with 1 week history of URI symptoms. Rhinorrhea, nasal congestion, nonproductive cough, body aches, diarrhea. Feels chest congestion. tmax 99.2. Denies fever, chills. Denies abdominal pain, nausea, vomiting. Denies loss of taste/smell.  States has had some dyspnea on exertion. Never smoker. COVID vaccine finished 08/2019. otc cough medicine without relief.   Denies history of asthma/lung disease.   Past Medical History:  Diagnosis Date  . Allergic rhinitis   . Dysplastic nevi    back,thigh,chest (07/2019)  . Elevated blood pressure reading without diagnosis of hypertension   . Family history of colon cancer    Uncle with col cancer, and father with adenomatous polyps in his 44s.  . Frequent headaches    secondary to chronic anxiety---CT scan brain normal in the past per pt report.  Marland Kitchen GAD (generalized anxiety disorder)   . GERD (gastroesophageal reflux disease)   . Irritable bowel syndrome   . Obesity, Class I, BMI 30-34.9     Allergies  Allergen Reactions  . Duloxetine Other (See  Comments)    Shaky, paranoid        Observations/Objective: General: Well appearing, nontoxic, no acute distress. Sitting comfortably. Head: Normocephalic, atraumatic Eye: No conjunctival injection, eyelid swelling. EOMI ENT: Mucus membranes moist, no lip cracking. No obvious nasal drainage. Pulm: Speaking in full sentences without difficulty. Normal effort. No respiratory distress, accessory muscle use. Walking throughout video without obvious shortness of breath Neuro: Normal mental status. Alert and oriented x 3.  Assessment and Plan: Will cover for bronchitis like symptoms with prednisone. Other symptomatic treatment discussed. If symptoms not improving, follow up in person for further evaluation. Return precautions given. Patient expresses understanding and agrees to plan.  Follow Up Instructions:    I discussed the assessment and treatment plan with the patient. The patient was provided an opportunity to ask questions and all were answered. The patient agreed with the plan and demonstrated an understanding of the instructions.   The patient was advised to call back or seek an in-person evaluation if the symptoms worsen or if the condition fails to improve as anticipated.  I provided 15 minutes of non-face-to-face time during this encounter.    Ok Edwards, PA-C  10/15/2019 11:36 AM         Ok Edwards, PA-C 10/15/19 1222

## 2019-10-17 ENCOUNTER — Telehealth: Payer: Self-pay

## 2019-10-17 NOTE — Telephone Encounter (Signed)
Patient refill request  LORazepam (ATIVAN) 0.5 MG tablet [id number]    [facility] _ [place]

## 2019-10-17 NOTE — Telephone Encounter (Signed)
Patient advised refill was being reviewed by PCP for approval. Will notify once complete

## 2019-10-17 NOTE — Telephone Encounter (Signed)
Requesting: Lorazepam Contract:06/10/18 UDS:n/a Last Visit:08/23/19 Next Visit:10/24/19 Last Refill:03/10/19(180,1), d/c on 10/15/19 by Gaylan Gerold after ED visit  Please Advise. Medication pending

## 2019-10-24 ENCOUNTER — Ambulatory Visit (INDEPENDENT_AMBULATORY_CARE_PROVIDER_SITE_OTHER): Payer: BC Managed Care – PPO | Admitting: Family Medicine

## 2019-10-24 ENCOUNTER — Encounter: Payer: Self-pay | Admitting: Family Medicine

## 2019-10-24 ENCOUNTER — Other Ambulatory Visit: Payer: Self-pay

## 2019-10-24 VITALS — BP 114/80 | HR 72 | Temp 98.0°F | Resp 16 | Ht 69.0 in | Wt 223.8 lb

## 2019-10-24 DIAGNOSIS — N5089 Other specified disorders of the male genital organs: Secondary | ICD-10-CM | POA: Diagnosis not present

## 2019-10-24 DIAGNOSIS — N451 Epididymitis: Secondary | ICD-10-CM

## 2019-10-24 MED ORDER — LEVOFLOXACIN 500 MG PO TABS: 500.0000 mg | ORAL_TABLET | Freq: Every day | ORAL | 0 refills | Status: AC

## 2019-10-24 NOTE — Progress Notes (Signed)
OFFICE VISIT  10/24/2019   CC:  Chief Complaint  Patient presents with  . Lump on private area    first noticed last week   HPI:    Patient is a 34 y.o. Caucasian male who presents for "lump on private area". A few days ago he noted nodular swelling on bottom of L testicle, tender to touch, some aching in L testicle on and off has been noted lately. Noted it incidentally when his daughter had jumped on him and accidentally hit GU area.  ROS: no fever, penile d/c, abd pain, dysuria, or nausea.  Past Medical History:  Diagnosis Date  . Allergic rhinitis   . Dysplastic nevi    back,thigh,chest (07/2019)  . Elevated blood pressure reading without diagnosis of hypertension   . Family history of colon cancer    Uncle with col cancer, and father with adenomatous polyps in his 60s.  . Frequent headaches    secondary to chronic anxiety---CT scan brain normal in the past per pt report.  Marland Kitchen GAD (generalized anxiety disorder)   . GERD (gastroesophageal reflux disease)   . Irritable bowel syndrome   . Obesity, Class I, BMI 30-34.9     Past Surgical History:  Procedure Laterality Date  . CLAVICLE SURGERY Left    bone spurs removed  . HAIR TRANSPLANT  09/2015   [facility]  . labrum repair Left     Outpatient Medications Prior to Visit  Medication Sig Dispense Refill  . cetirizine (ZYRTEC) 10 MG tablet Take 10 mg by mouth daily.    . finasteride (PROPECIA) 1 MG tablet Take 1 tablet (1 mg total) by mouth daily. 90 tablet 3  . LORazepam (ATIVAN) 0.5 MG tablet TAKE 1 TABLET BY MOUTH TWICE A DAY AS NEEDED FOR MODERATE-SEVERE ANXIETY 180 tablet 1  . pantoprazole (PROTONIX) 40 MG tablet Take 1 tablet (40 mg total) by mouth 2 (two) times daily. 60 tablet 5  . albuterol (VENTOLIN HFA) 108 (90 Base) MCG/ACT inhaler Inhale 1-2 puffs into the lungs every 6 (six) hours as needed for wheezing or shortness of breath. (Patient not taking: Reported on 10/24/2019) 8 g 0  . ipratropium (ATROVENT) 0.06 %  nasal spray Place 2 sprays into both nostrils 4 (four) times daily. (Patient not taking: Reported on 10/24/2019) 15 mL 0  . predniSONE (DELTASONE) 50 MG tablet Take 1 tablet (50 mg total) by mouth daily with breakfast. (Patient not taking: Reported on 10/24/2019) 5 tablet 0   No facility-administered medications prior to visit.    Allergies  Allergen Reactions  . Duloxetine Other (See Comments)    Shaky, paranoid      ROS As per HPI  PE: Blood pressure 114/80, pulse 72, temperature 98 F (36.7 C), temperature source Temporal, resp. rate 16, height 5\' 9"  (1.753 m), weight 223 lb 12.8 oz (101.5 kg), SpO2 97 %. Gen: Alert, well appearing.  Patient is oriented to person, place, time, and situation. AFFECT: pleasant, lucid thought and speech. Genitals normal; right testicle normal without tenderness, masses, hydroceles, varicoceles, erythema or swelling. Left testicle with small, soft, nodular lesion palpable at inferoposterior aspect of testicle.  Mild TTP diffusely L testicle.  Shaft normal, circumcised, meatus normal without discharge. No inguinal hernia noted. No inguinal lymphadenopathy.   LABS:    Chemistry      Component Value Date/Time   NA 140 03/15/2018 1113   K 4.4 03/15/2018 1113   CL 101 03/15/2018 1113   CO2 33 (H) 03/15/2018 1113  BUN 16 03/15/2018 1113   CREATININE 0.97 03/15/2018 1113      Component Value Date/Time   CALCIUM 10.0 03/15/2018 1113   ALKPHOS 45 03/15/2018 1113   AST 21 03/15/2018 1113   ALT 37 03/15/2018 1113   BILITOT 0.6 03/15/2018 1113      IMPRESSION AND PLAN:  Left testicle nodule, feels benign. Given his tenderness of this area + diffusely in entire L testicle I think he does have a component of infection.  Will treat with levaquin 500 mg qd x 10d for epididymo-orchitis. Ordered scrotal u/s to further eval this problem. Discussed wearing briefs, elevate testicle, avoid activities that involve lots of excessive gU motion, esp since he has  started the habit of regular workouts with a trainer lately.  An After Visit Summary was printed and given to the patient.  FOLLOW UP: Return for to be determined based on results of ultrasound and on response to treatment.  Signed:  Crissie Sickles, MD           10/24/2019

## 2019-10-30 ENCOUNTER — Emergency Department (HOSPITAL_BASED_OUTPATIENT_CLINIC_OR_DEPARTMENT_OTHER)
Admission: EM | Admit: 2019-10-30 | Discharge: 2019-10-30 | Disposition: A | Discharge status: Home / Self Care | Payer: BC Managed Care – PPO | Attending: Emergency Medicine | Admitting: Emergency Medicine

## 2019-10-30 ENCOUNTER — Emergency Department (HOSPITAL_BASED_OUTPATIENT_CLINIC_OR_DEPARTMENT_OTHER): Payer: BC Managed Care – PPO

## 2019-10-30 ENCOUNTER — Encounter (HOSPITAL_BASED_OUTPATIENT_CLINIC_OR_DEPARTMENT_OTHER): Payer: Self-pay

## 2019-10-30 ENCOUNTER — Other Ambulatory Visit: Payer: Self-pay

## 2019-10-30 DIAGNOSIS — R609 Edema, unspecified: Secondary | ICD-10-CM | POA: Diagnosis not present

## 2019-10-30 DIAGNOSIS — M25571 Pain in right ankle and joints of right foot: Secondary | ICD-10-CM | POA: Diagnosis not present

## 2019-10-30 DIAGNOSIS — W010XXA Fall on same level from slipping, tripping and stumbling without subsequent striking against object, initial encounter: Secondary | ICD-10-CM | POA: Diagnosis not present

## 2019-10-30 DIAGNOSIS — Z79899 Other long term (current) drug therapy: Secondary | ICD-10-CM | POA: Diagnosis not present

## 2019-10-30 DIAGNOSIS — S93401A Sprain of unspecified ligament of right ankle, initial encounter: Secondary | ICD-10-CM

## 2019-10-30 DIAGNOSIS — S99911A Unspecified injury of right ankle, initial encounter: Secondary | ICD-10-CM | POA: Diagnosis not present

## 2019-10-30 DIAGNOSIS — Y92017 Garden or yard in single-family (private) house as the place of occurrence of the external cause: Secondary | ICD-10-CM | POA: Insufficient documentation

## 2019-10-30 DIAGNOSIS — J45909 Unspecified asthma, uncomplicated: Secondary | ICD-10-CM | POA: Insufficient documentation

## 2019-10-30 DIAGNOSIS — Y9389 Activity, other specified: Secondary | ICD-10-CM | POA: Insufficient documentation

## 2019-10-30 DIAGNOSIS — R0902 Hypoxemia: Secondary | ICD-10-CM | POA: Diagnosis not present

## 2019-10-30 DIAGNOSIS — Y999 Unspecified external cause status: Secondary | ICD-10-CM | POA: Diagnosis not present

## 2019-10-30 DIAGNOSIS — W19XXXA Unspecified fall, initial encounter: Secondary | ICD-10-CM | POA: Diagnosis not present

## 2019-10-30 DIAGNOSIS — R52 Pain, unspecified: Secondary | ICD-10-CM | POA: Diagnosis not present

## 2019-10-30 MED ORDER — NAPROXEN 375 MG PO TABS: 375.0000 mg | ORAL_TABLET | Freq: Two times a day (BID) | ORAL | 0 refills | Status: AC

## 2019-10-30 MED ORDER — NAPROXEN 250 MG PO TABS
500.0000 mg | ORAL_TABLET | Freq: Once | ORAL | Status: AC
  Administered 2019-10-30: 500 mg via ORAL
  Filled 2019-10-30: qty 2

## 2019-10-30 NOTE — ED Triage Notes (Signed)
Fall in yard today after twisting right ankle.

## 2019-10-30 NOTE — ED Provider Notes (Signed)
[facility] DEPARTMENT Provider Note   CSN: [id number] Arrival date & time: 10/30/19  1631     History Chief Complaint  Patient presents with  . Ankle Pain    Alec Downs is a 34 y.o. male.  34 y.o male with no PMH presents to the ED with a chief complaint of right ankle pain status post fall x1 hour prior to arrival.  Patient reports he was outside, looking out into a turtle, states he likely rolled his ankle unknown whether he inverted or everted his right foot.  There is swelling noted to the lateral aspect of his right foot.  He has not been able to his weight on the right foot.  Has not taken any medication for improvement in symptoms.  Denies any other injury or complaint.  The history is provided by the patient.       Past Medical History:  Diagnosis Date  . Allergic rhinitis   . Dysplastic nevi    back,thigh,chest (07/2019)  . Elevated blood pressure reading without diagnosis of hypertension   . Family history of colon cancer    Uncle with col cancer, and father with adenomatous polyps in his 35s.  . Frequent headaches    secondary to chronic anxiety---CT scan brain normal in the past per pt report.  Marland Kitchen GAD (generalized anxiety disorder)   . GERD (gastroesophageal reflux disease)   . Irritable bowel syndrome   . Obesity, Class I, BMI 30-34.9     Patient Active Problem List   Diagnosis Date Noted  . Gastroesophageal reflux disease 09/05/2019  . Dysphagia 09/05/2019  . Atypical chest pain 09/05/2019  . Altered bowel habits 09/05/2019  . Hematemesis 09/05/2019  . Left knee pain 05/10/2018  . Spondylosis 05/10/2018  . Mild intermittent asthma without complication [id number]  . Panic disorder with agoraphobia and moderate panic attacks 08/30/2017  . Injury of left shoulder 07/08/2016    Past Surgical History:  Procedure Laterality Date  . CLAVICLE SURGERY Left    bone spurs removed  . HAIR TRANSPLANT  09/2015   [facility]  . labrum  repair Left        Family History  Problem Relation Age of Onset  . Hypertension Mother   . Arthritis Father   . Irritable bowel syndrome Father   . Colon polyps Father   . Other Father        esophageal polyps ?  Marland Kitchen Stroke Maternal Grandmother   . Diabetes Maternal Grandmother   . Stroke Maternal Grandfather   . Stroke Paternal Grandmother   . Aneurysm Paternal Grandmother   . Stroke Paternal Grandfather   . Colon cancer Paternal Grandfather   . Pancreatic cancer Maternal Uncle   . Diabetes Maternal Uncle   . Diabetes Maternal Aunt     Social History   Tobacco Use  . Smoking status: Never Smoker  . Smokeless tobacco: Never Used  Substance Use Topics  . Alcohol use: Yes    Comment: rare  . Drug use: No    Home Medications Prior to Admission medications   Medication Sig Start Date End Date Taking? Authorizing Provider  albuterol (VENTOLIN HFA) 108 (90 Base) MCG/ACT inhaler Inhale 1-2 puffs into the lungs every 6 (six) hours as needed for wheezing or shortness of breath. Patient not taking: Reported on 10/24/2019 10/15/19   Ok Edwards, PA-C  cetirizine (ZYRTEC) 10 MG tablet Take 10 mg by mouth daily.    [provider]  finasteride (PROPECIA)  1 MG tablet Take 1 tablet (1 mg total) by mouth daily. 03/10/19   McGowen, Adrian Blackwater, MD  levofloxacin (LEVAQUIN) 500 MG tablet Take 1 tablet (500 mg total) by mouth daily for 10 days. 10/24/19 11/03/19  McGowen, Adrian Blackwater, MD  LORazepam (ATIVAN) 0.5 MG tablet TAKE 1 TABLET BY MOUTH TWICE A DAY AS NEEDED FOR MODERATE-SEVERE ANXIETY 10/17/19   McGowen, Adrian Blackwater, MD  naproxen (NAPROSYN) 375 MG tablet Take 1 tablet (375 mg total) by mouth 2 (two) times daily with a meal for 7 days. 10/30/19 11/06/19  Janeece Fitting, PA-C  pantoprazole (PROTONIX) 40 MG tablet Take 1 tablet (40 mg total) by mouth 2 (two) times daily. 09/05/19   Zehr, Laban Emperor, PA-C    Allergies    Duloxetine and Lisinopril  Review of Systems   Review of Systems    Constitutional: Negative for fever.  Musculoskeletal: Positive for arthralgias.    Physical Exam Updated Vital Signs BP (!) 153/96 (BP Location: Right Arm)   Pulse 77   Temp 98.2 F (36.8 C) (Oral)   Resp 18   Ht 5\' 9"  (1.753 m)   Wt 100.2 kg   SpO2 98%   BMI 32.64 kg/m   Physical Exam Vitals and nursing note reviewed.  Constitutional:      Appearance: Normal appearance.  HENT:     Head: Normocephalic and atraumatic.     Nose: Nose normal.  Eyes:     Pupils: Pupils are equal, round, and reactive to light.  Cardiovascular:     Rate and Rhythm: Normal rate.  Pulmonary:     Effort: Pulmonary effort is normal.  Abdominal:     General: Abdomen is flat.  Musculoskeletal:        General: Swelling and tenderness present.     Cervical back: Normal range of motion and neck supple.     Right ankle: Swelling present. No lacerations. Tenderness present over the lateral malleolus. Decreased range of motion. Normal pulse.     Comments: swelling noted to the lateral malleolus, tenderness with palpation along the lateral and dorsum aspect of the foot.  Decreased range of motion due to pain.  Capillary refills intact.  Skin:    General: Skin is warm and dry.  Neurological:     Mental Status: He is alert and oriented to person, place, and time.     ED Results / Procedures / Treatments   Labs (all labs ordered are listed, but only abnormal results are displayed) Labs Reviewed - No data to display  EKG None  Radiology DG Ankle Complete Right  Result Date: 10/30/2019 CLINICAL DATA:  [place] and rolled right ankle. Right ankle pain and swelling. Initial encounter. EXAM: RIGHT ANKLE - COMPLETE 3+ VIEW COMPARISON:  None. FINDINGS: There is no evidence of fracture, dislocation, or joint effusion. There is no evidence of arthropathy or other focal bone abnormality. Soft tissues are unremarkable. IMPRESSION: Negative. Electronically Signed   By: Marlaine Hind M.D.   On: 10/30/2019 16:59     Procedures Procedures (including critical care time)  Medications Ordered in ED Medications  naproxen (NAPROSYN) tablet 500 mg (500 mg Oral Given 10/30/19 1741)    ED Course  I have reviewed the triage vital signs and the nursing notes.  Pertinent labs & imaging results that were available during my care of the patient were reviewed by me and considered in my medical decision making (see chart for details).    MDM Rules/Calculators/A&P   Patient with no  pertinent past medical history presents to the ED with complaints of right ankle pain status post fall.  Reports he was outside, when he likely rolled his ankle.  There is swelling noted to the lateral aspect of the malleolus, limited range of motion due to pain.  He is neurovascularly intact without any open lacerations or abrasions.  No prior surgical intervention to his right ankle.  Has not take any medication for improvement in symptoms.  Has not been able to weight-bear due to pain.  X-ray of his right foot did not show any fracture, dislocation.  We discussed results at length, he was advised that occult fractures can occur and he should obtain follow-up if pain persists.  RICE therapy was encouraged.  We will place him on an air splint to help with symptomatic control.  He was also given crutches to help with ambulation.  Orthopedic referral was also obtained.  Vitals are within normal limits.  Patient stable for discharge.  Portions of this note were generated with [employer]. Dictation errors may occur despite best attempts at proofreading.  Final Clinical Impression(s) / ED Diagnoses Final diagnoses:  Sprain of right ankle, unspecified ligament, initial encounter    Rx / DC Orders ED Discharge Orders         Ordered    naproxen (NAPROSYN) 375 MG tablet  2 times daily with meals     10/30/19 1732           Janeece Fitting, PA-C 10/30/19 [place], [place], DO 10/30/19 1749

## 2019-10-30 NOTE — Discharge Instructions (Addendum)
Your x-ray today did not show any fracture or dislocation.  I have prescribed a short course of anti-inflammatories to help with the swelling and pain.  You may also elevate your right ankle, apply ice, follow-up with orthopedist as needed.

## 2019-10-31 ENCOUNTER — Telehealth: Payer: Self-pay | Admitting: Gastroenterology

## 2019-10-31 NOTE — Telephone Encounter (Signed)
Dr. Tarri Glenn Just wanted to let you know that this pt has cancelled his ENDO/COLON for 11-02-19 @ 11:00am  He has broken his ankle. He reschedueld for 12/27/19

## 2019-10-31 NOTE — Telephone Encounter (Signed)
Thanks for the update

## 2019-11-01 ENCOUNTER — Encounter: Payer: Self-pay | Admitting: Family Medicine

## 2019-11-01 NOTE — Telephone Encounter (Signed)
Patient was given Naproxen. Please advise if appropriate?

## 2019-11-02 ENCOUNTER — Encounter: Payer: BC Managed Care – PPO | Admitting: Gastroenterology

## 2019-11-10 ENCOUNTER — Other Ambulatory Visit: Payer: Self-pay

## 2019-11-10 DIAGNOSIS — N5089 Other specified disorders of the male genital organs: Secondary | ICD-10-CM

## 2019-11-28 ENCOUNTER — Encounter: Payer: Self-pay | Admitting: Family Medicine

## 2019-11-28 MED ORDER — SCOPOLAMINE 1 MG/3DAYS TD PT72: MEDICATED_PATCH | TRANSDERMAL | 1 refills | Status: DC

## 2019-11-28 NOTE — Telephone Encounter (Signed)
  Patient sent a MyChart message regarding RF for scopolamine 1mg /3 day patches. Last refill done 11/26/17 (4,1) and last o/v 10/24/19. Medication pending, please advise

## 2019-11-29 NOTE — Telephone Encounter (Signed)
Left detailed message advising RF sent, okay per dpr

## 2019-12-05 ENCOUNTER — Encounter: Payer: Self-pay | Admitting: Family Medicine

## 2019-12-05 IMAGING — DX DG LUMBAR SPINE COMPLETE 4+V
5 series · 5 of 5 positions shown · non-contrast
Comparison: None.

CLINICAL DATA: Pain in the low back, no acute injury

EXAM:
LUMBAR SPINE - COMPLETE 4+ VIEW

[lumbar spine ap]
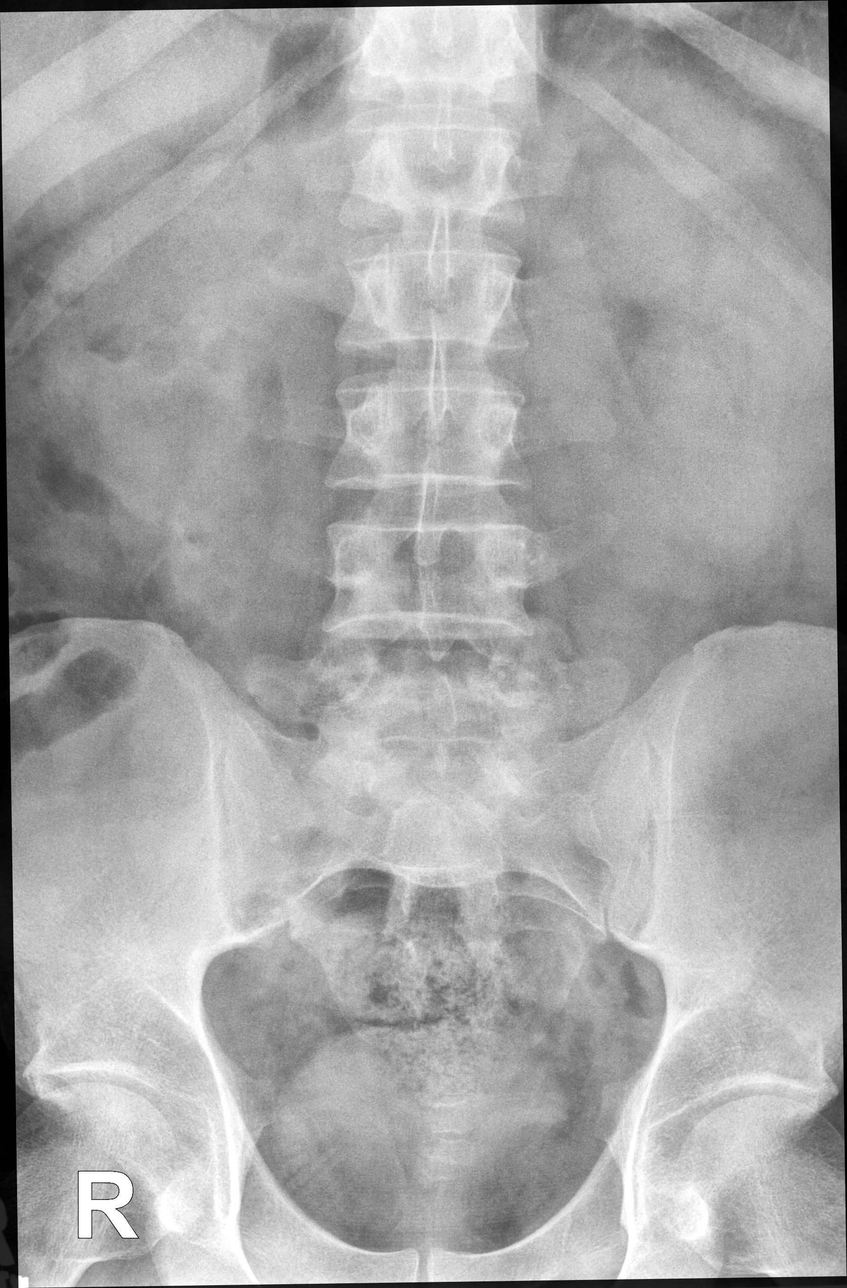

[lumbar spine oblique (1 of 2)]
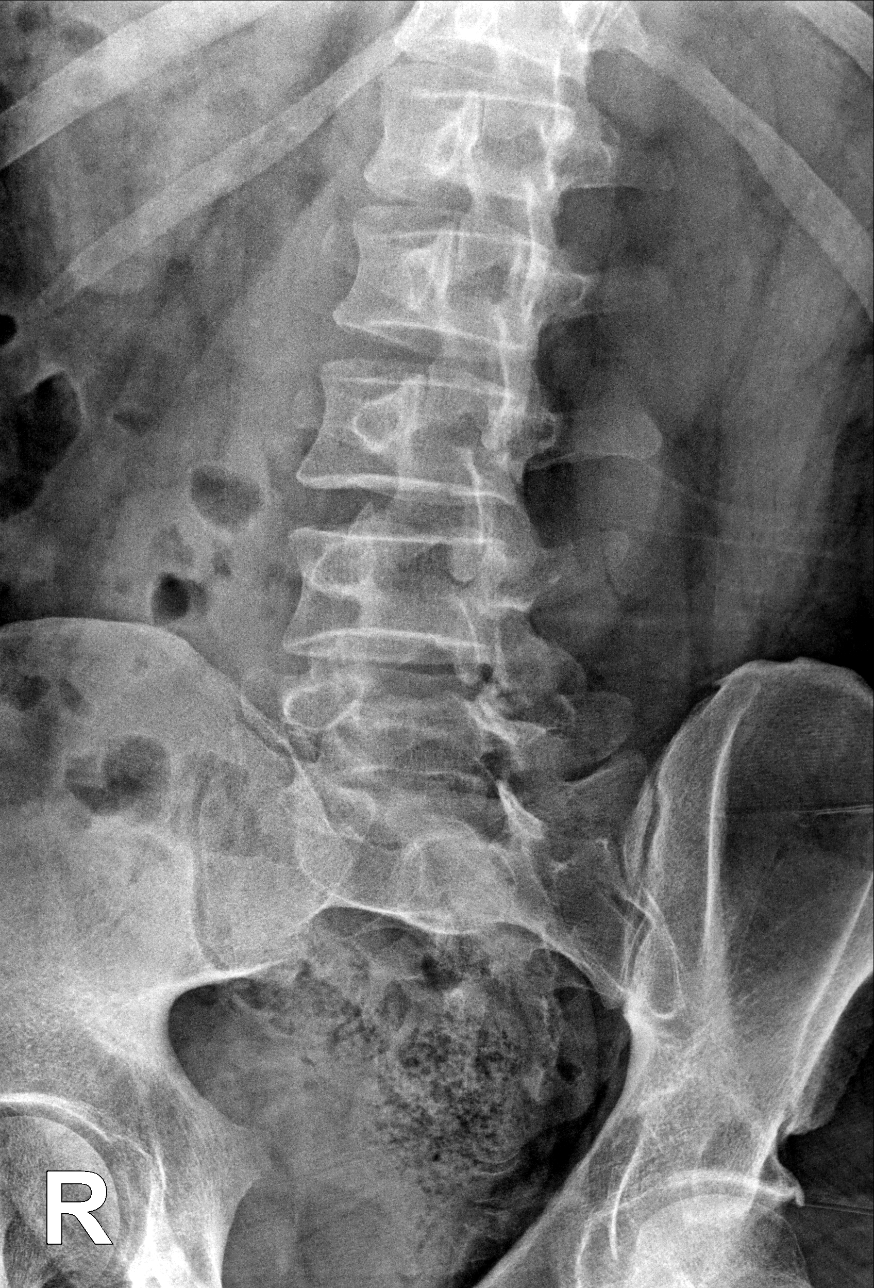

[lumbar spine oblique (2 of 2)]
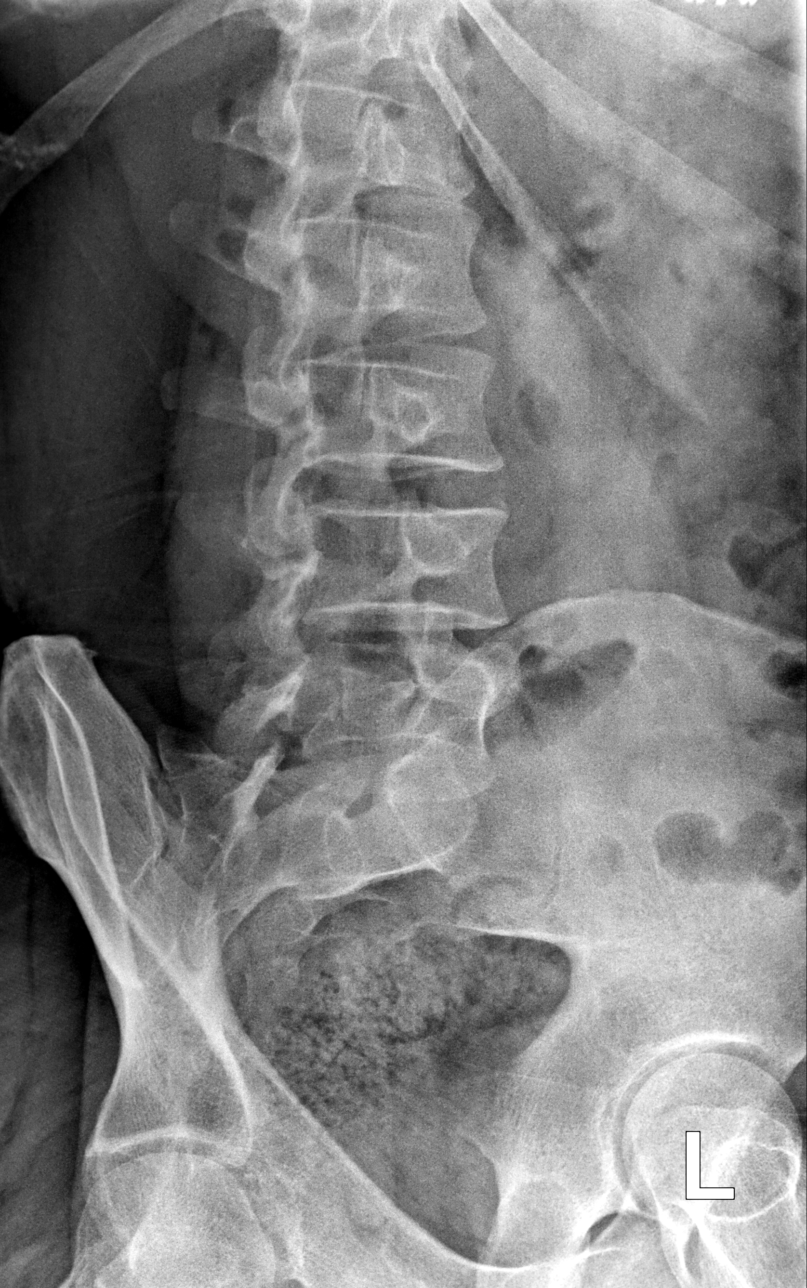

[lumbar spine lat (1 of 2)]
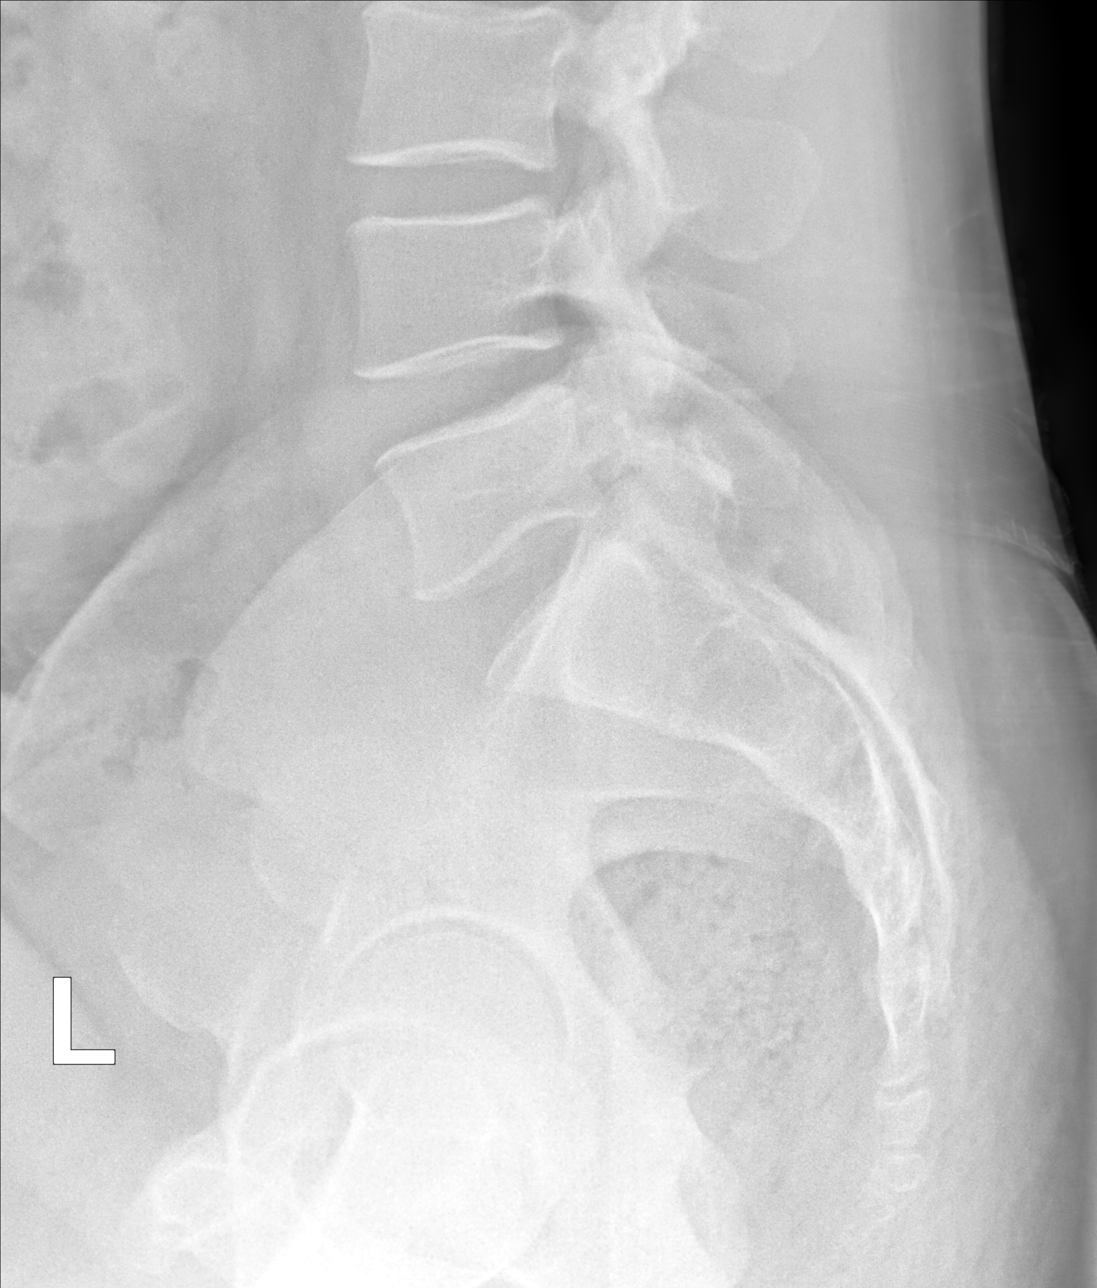

[lumbar spine lat (2 of 2)]
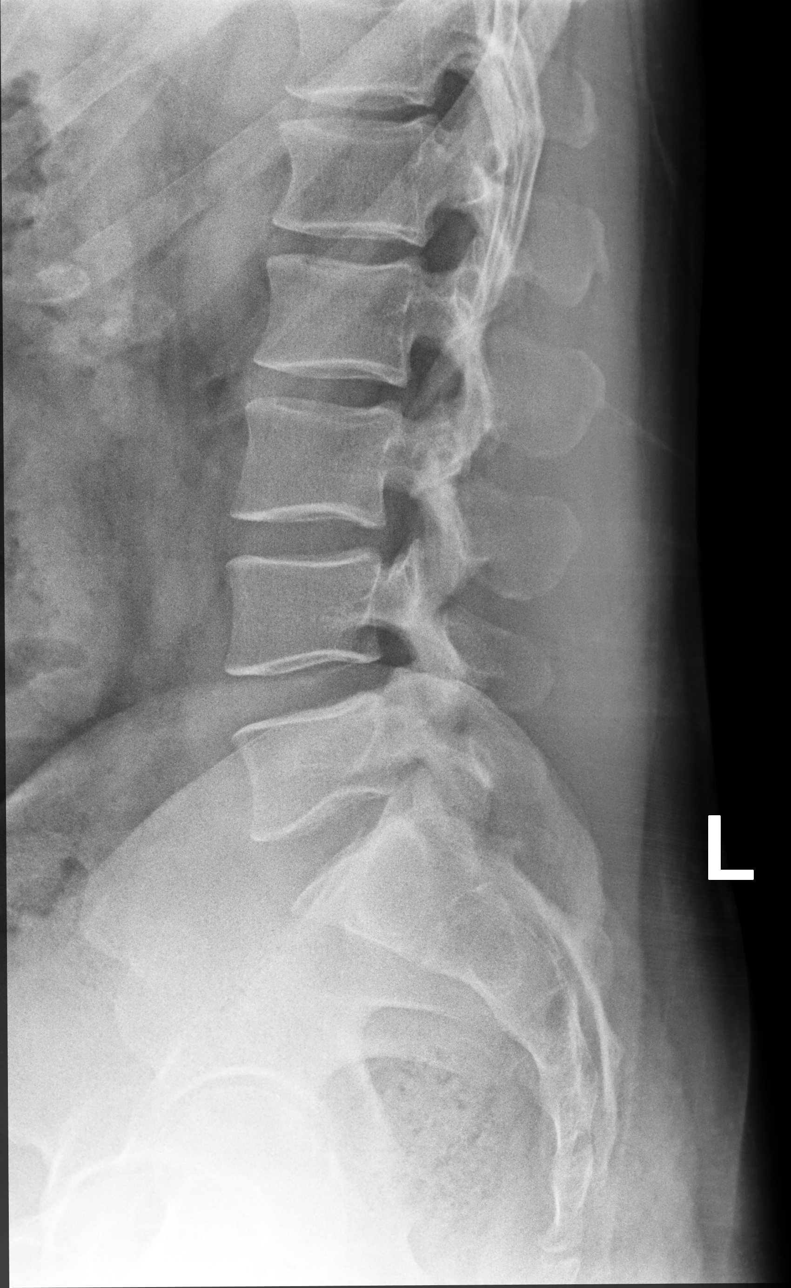

[5 of 5 positions shown; findings below may reference images not displayed]

FINDINGS: The lumbar vertebrae are in normal alignment. Intervertebral disc
spaces appear normal. No compression deformity is seen. On oblique
views there do appear to be bilateral pars defects which have been
documented with compared to dictated report from CT lumbar spine of
11/27/2003. However no malalignment is seen. The SI joints appear
well corticated. The bowel gas pattern is nonspecific.
IMPRESSION: 1. Normal alignment of the lumbar vertebrae with normal disc spaces.
No acute abnormality.
2. Pars defects at L5.  SI joints appear normal.

## 2019-12-18 ENCOUNTER — Ambulatory Visit: Payer: BC Managed Care – PPO | Admitting: Family Medicine

## 2019-12-18 ENCOUNTER — Encounter: Payer: Self-pay | Admitting: Family Medicine

## 2019-12-18 NOTE — Progress Notes (Deleted)
OFFICE VISIT  12/18/2019   CC: No chief complaint on file.  HPI:    Patient is a 34 y.o. Caucasian male who presents for ED f/u for acute R ankle sprain sustained 10/30/19 (date of ED visit 10/30/19). Reviewed all ED records today.  R ankle x-ray NEG for fx or any other acute abnormality.  RICE, air splint, ortho referral discussed.   Past Medical History:  Diagnosis Date  . Allergic rhinitis   . Dysplastic nevi    back,thigh,chest (07/2019)  . Elevated blood pressure reading without diagnosis of hypertension   . Epididymitis 09/2019   ordered scrotal u/s to further eval ? "nodule" pt felt at the time but pt did not return radiology's calls to schedule.  . Family history of colon cancer    Uncle with col cancer, and father with adenomatous polyps in his 70s.  . Frequent headaches    secondary to chronic anxiety---CT scan brain normal in the past per pt report.  Marland Kitchen GAD (generalized anxiety disorder)   . GERD (gastroesophageal reflux disease)   . Irritable bowel syndrome   . Obesity, Class I, BMI 30-34.9     Past Surgical History:  Procedure Laterality Date  . CLAVICLE SURGERY Left    bone spurs removed  . HAIR TRANSPLANT  09/2015   [facility]  . labrum repair Left     Outpatient Medications Prior to Visit  Medication Sig Dispense Refill  . albuterol (VENTOLIN HFA) 108 (90 Base) MCG/ACT inhaler Inhale 1-2 puffs into the lungs every 6 (six) hours as needed for wheezing or shortness of breath. (Patient not taking: Reported on 10/24/2019) 8 g 0  . cetirizine (ZYRTEC) 10 MG tablet Take 10 mg by mouth daily.    Marland Kitchen esomeprazole (NEXIUM) 20 MG capsule Take by mouth.    . finasteride (PROPECIA) 1 MG tablet Take 1 tablet (1 mg total) by mouth daily. 90 tablet 3  . LORazepam (ATIVAN) 0.5 MG tablet TAKE 1 TABLET BY MOUTH TWICE A DAY AS NEEDED FOR MODERATE-SEVERE ANXIETY 180 tablet 1  . pantoprazole (PROTONIX) 40 MG tablet Take 1 tablet (40 mg total) by mouth 2 (two) times daily. 60 tablet 5   . scopolamine (TRANSDERM-SCOP) 1 MG/3DAYS APPLY 1 TIME BY TRANSDERMAL ROUTE EVERY 3 DAYS 4 patch 1   No facility-administered medications prior to visit.    Allergies  Allergen Reactions  . Duloxetine Other (See Comments)    Shaky, paranoid    . Lisinopril     ROS As per HPI  PE: There were no vitals taken for this visit. ***  LABS:  ***  IMPRESSION AND PLAN:  No problem-specific Assessment & Plan notes found for this encounter.   An After Visit Summary was printed and given to the patient.  FOLLOW UP: No follow-ups on file.

## 2019-12-26 ENCOUNTER — Ambulatory Visit (HOSPITAL_BASED_OUTPATIENT_CLINIC_OR_DEPARTMENT_OTHER)
Admission: RE | Admit: 2019-12-26 | Discharge: 2019-12-26 | Disposition: A | Discharge status: Home / Self Care | Payer: BC Managed Care – PPO | Source: Ambulatory Visit | Attending: Family Medicine | Admitting: Family Medicine

## 2019-12-26 ENCOUNTER — Other Ambulatory Visit: Payer: Self-pay

## 2019-12-26 DIAGNOSIS — N5089 Other specified disorders of the male genital organs: Secondary | ICD-10-CM

## 2019-12-26 DIAGNOSIS — N433 Hydrocele, unspecified: Secondary | ICD-10-CM | POA: Diagnosis not present

## 2019-12-26 DIAGNOSIS — I861 Scrotal varices: Secondary | ICD-10-CM | POA: Diagnosis not present

## 2019-12-27 ENCOUNTER — Encounter: Payer: BC Managed Care – PPO | Admitting: Gastroenterology

## 2020-01-11 DIAGNOSIS — Z20822 Contact with and (suspected) exposure to covid-19: Secondary | ICD-10-CM | POA: Diagnosis not present

## 2020-01-13 DIAGNOSIS — Z20822 Contact with and (suspected) exposure to covid-19: Secondary | ICD-10-CM | POA: Diagnosis not present

## 2020-01-19 DIAGNOSIS — Z20828 Contact with and (suspected) exposure to other viral communicable diseases: Secondary | ICD-10-CM | POA: Diagnosis not present

## 2020-01-24 NOTE — Telephone Encounter (Signed)
He may reschedule at his convenience. Thank you.

## 2020-01-24 NOTE — Telephone Encounter (Signed)
Hi Dr. Tarri Glenn, this Alec Downs just called to cancel endo colon that was scheduled with you on 9/17 because he just tested positive for Covid 19. His pre-visit was scheduled for tomorrow. Because there was nothing available in the morning I was not able to r/s his procedure. I see that he saw Janett Billow back in April. Does he need to come for an OV visit again or can we reschedule his procedure for November when he calls back? Thank you.

## 2020-02-16 ENCOUNTER — Encounter: Payer: BC Managed Care – PPO | Admitting: Gastroenterology

## 2020-02-16 NOTE — Telephone Encounter (Signed)
Left message to call back to r/s procedure in November, pt needs morning appt.

## 2020-03-29 ENCOUNTER — Other Ambulatory Visit: Payer: Self-pay | Admitting: Gastroenterology

## 2020-04-22 ENCOUNTER — Other Ambulatory Visit: Payer: Self-pay | Admitting: Family Medicine

## 2020-04-22 ENCOUNTER — Other Ambulatory Visit: Payer: Self-pay | Admitting: Gastroenterology

## 2020-04-24 ENCOUNTER — Encounter: Payer: Self-pay | Admitting: Family Medicine

## 2020-04-24 MED ORDER — LORAZEPAM 0.5 MG PO TABS: ORAL_TABLET | ORAL | 0 refills | Status: DC

## 2020-04-24 NOTE — Telephone Encounter (Signed)
#  30 lorazepam eRx'd

## 2020-04-30 ENCOUNTER — Other Ambulatory Visit: Payer: Self-pay

## 2020-04-30 MED ORDER — FINASTERIDE 1 MG PO TABS
1.0000 mg | ORAL_TABLET | Freq: Every day | ORAL | 0 refills | Status: DC
Start: 2020-04-30 — End: 2020-05-23

## 2020-05-01 DIAGNOSIS — J019 Acute sinusitis, unspecified: Secondary | ICD-10-CM | POA: Diagnosis not present

## 2020-05-17 ENCOUNTER — Other Ambulatory Visit: Payer: Self-pay

## 2020-05-17 ENCOUNTER — Encounter: Payer: Self-pay | Admitting: Family Medicine

## 2020-05-17 ENCOUNTER — Ambulatory Visit: Payer: 59 | Admitting: Family Medicine

## 2020-05-17 VITALS — BP 126/72 | HR 67 | Temp 97.9°F | Resp 16 | Ht 69.0 in | Wt 215.0 lb

## 2020-05-17 DIAGNOSIS — K219 Gastro-esophageal reflux disease without esophagitis: Secondary | ICD-10-CM | POA: Diagnosis not present

## 2020-05-17 DIAGNOSIS — I10 Essential (primary) hypertension: Secondary | ICD-10-CM

## 2020-05-17 DIAGNOSIS — F411 Generalized anxiety disorder: Secondary | ICD-10-CM

## 2020-05-17 MED ORDER — LORAZEPAM 0.5 MG PO TABS
ORAL_TABLET | ORAL | 1 refills | Status: DC
Start: 2020-05-17 — End: 2020-11-27

## 2020-05-17 NOTE — Progress Notes (Signed)
OFFICE VISIT  05/17/2020  CC:  Chief Complaint  Patient presents with  . Follow-up    RCI, pt is not fasting   HPI:    Patient is a 34 y.o. Caucasian male who presents for f/u GAD, HTN, and GERD. Feeling well physically and working out regularly and has lost 15 lbs purposefully the last 6 mo. Stressed a lot as usual, not sleeping very well, worse lately since not taking loraz as regularly b/c running low.  GERD, hx of esophagitis and dysphagia: saw GI almost a year ago and was going to get endoscopy but he had to cancel b/c of kids being ill/he tested + for covid. Did not reschedule. GER sx's have been much LESS except for some in the last couple weeks with [facility] increased stress.  Says wt loss has helped this---bloating much less, less regurg, less substernal burning.  Denies any further dysphagia.  He cut out sodas, cut way back on coffee intake, less fried foods.  HTN: lisinopril trial last year--unclear hx of ?intol to this med and bp not too bad upon f/u 07/2019 so we left him off bp meds at that time. He recalls one bp check the last couple months---"130s/90". Very good bp here today.  Anxiety: Lorazepam 0.5mg  bid prn has been helpful PMP AWARE reviewed today: most recent rx for lorazepam 0.5mg  was filled 04/24/20, # 30, rx by me. No red flags.  ROS: no fevers, no CP, no SOB, no wheezing, no cough, no dizziness, no HAs, no rashes, no melena/hematochezia.  No polyuria or polydipsia.  No myalgias or arthralgias.  No focal weakness, paresthesias, or tremors.  No acute vision or hearing abnormalities. No n/v/d or abd pain.  No palpitations.    Past Medical History:  Diagnosis Date  . Allergic rhinitis   . Dysplastic nevi    back,thigh,chest (07/2019)  . Elevated blood pressure reading without diagnosis of hypertension   . Epididymitis 09/2019   ordered scrotal u/s to further eval ? "nodule" pt felt at the time but pt did not return radiology's calls to schedule.  . Family  history of colon cancer    Uncle with col cancer, and father with adenomatous polyps in his 70s.  . Frequent headaches    secondary to chronic anxiety---CT scan brain normal in the past per pt report.  Marland Kitchen GAD (generalized anxiety disorder)   . GERD (gastroesophageal reflux disease)   . Irritable bowel syndrome   . Obesity, Class I, BMI 30-34.9     Past Surgical History:  Procedure Laterality Date  . CLAVICLE SURGERY Left    bone spurs removed  . HAIR TRANSPLANT  09/2015   [facility]  . labrum repair Left     Outpatient Medications Prior to Visit  Medication Sig Dispense Refill  . cetirizine (ZYRTEC) 10 MG tablet Take 10 mg by mouth daily.    . finasteride (PROPECIA) 1 MG tablet Take 1 tablet (1 mg total) by mouth daily. 30 tablet 0  . pantoprazole (PROTONIX) 40 MG tablet TAKE 1 TABLET BY MOUTH TWICE A DAY 180 tablet 1  . LORazepam (ATIVAN) 0.5 MG tablet TAKE 1 TABLET BY MOUTH TWICE A DAY AS NEEDED FOR MODERATE-SEVERE ANXIETY 30 tablet 0  . scopolamine (TRANSDERM-SCOP) 1 MG/3DAYS APPLY 1 TIME BY TRANSDERMAL ROUTE EVERY 3 DAYS (Patient not taking: Reported on 05/17/2020) 4 patch 1  . albuterol (VENTOLIN HFA) 108 (90 Base) MCG/ACT inhaler Inhale 1-2 puffs into the lungs every 6 (six) hours as needed for wheezing  or shortness of breath. (Patient not taking: No sig reported) 8 g 0  . esomeprazole (NEXIUM) 20 MG capsule Take by mouth. (Patient not taking: Reported on 05/17/2020)     No facility-administered medications prior to visit.    Allergies  Allergen Reactions  . Duloxetine Other (See Comments)    Shaky, paranoid    . Lisinopril     ROS As per HPI  PE: Vitals with BMI 05/17/2020 10/30/2019 10/24/2019  Height 5\' 9"  5\' 9"  5\' 9"   Weight 215 lbs 221 lbs 223 lbs 13 oz  BMI 31.74 15.05 69.79  Systolic 480 165 537  Diastolic 72 96 80  Pulse 67 77 72   Gen: Alert, well appearing.  Patient is oriented to person, place, time, and situation. AFFECT: pleasant, lucid thought and  speech. CV: RRR, no m/r/g.   LUNGS: CTA bilat, nonlabored resps, good aeration in all lung fields. EXT: no clubbing or cyanosis.  no edema.    LABS:  Lab Results  Component Value Date   TSH 2.20 03/15/2018   Lab Results  Component Value Date   WBC 5.7 03/15/2018   HGB 16.3 03/15/2018   HCT 48.2 03/15/2018   MCV 89.0 03/15/2018   PLT 228.0 03/15/2018   Lab Results  Component Value Date   CREATININE 0.97 03/15/2018   BUN 16 03/15/2018   NA 140 03/15/2018   K 4.4 03/15/2018   CL 101 03/15/2018   CO2 33 (H) 03/15/2018   Lab Results  Component Value Date   ALT 37 03/15/2018   AST 21 03/15/2018   ALKPHOS 45 03/15/2018   BILITOT 0.6 03/15/2018   Lab Results  Component Value Date   CHOL 149 03/15/2018   Lab Results  Component Value Date   HDL 42.00 03/15/2018   Lab Results  Component Value Date   LDLCALC 78 03/15/2018   Lab Results  Component Value Date   TRIG 148.0 03/15/2018   Lab Results  Component Value Date   CHOLHDL 4 03/15/2018    IMPRESSION AND PLAN:  1) GAD, does well with long term scheduled use of loraz 0.5mg  bid. Updated CSC today. Erx'd loraz 0.5mg , 1 bid, #180, RF x 1.  2) HTN: NOT on meds. Bp great here today.  One home measurement recalled by pt --diast 90. Need him to check more home bp's. No meds rx'd today.  3) GERD, hx of dysphagia and regurg. Since he has changed diet and lost some wt his sx's are [facility] better controlled with daily PPI. No more dysphagia. EGD was planned by GI but a couple of situations caused cancellation of this procedure. He has to f/u with them but looks like maybe he will not need endoscopy after all. Cont protonix 40mg  qd and good diet/exercise/wt loss.  An After Visit Summary was printed and given to the patient.  FOLLOW UP: Return in about 6 months (around 11/15/2020) for annual CPE (fasting) + f/u anx/med.  Signed:  Crissie Sickles, MD           05/17/2020

## 2020-05-23 ENCOUNTER — Other Ambulatory Visit: Payer: Self-pay | Admitting: Family Medicine

## 2020-06-04 ENCOUNTER — Encounter: Payer: Self-pay | Admitting: Family Medicine

## 2020-06-05 NOTE — Telephone Encounter (Signed)
FYI

## 2020-06-06 ENCOUNTER — Other Ambulatory Visit: Payer: Self-pay | Admitting: Family Medicine

## 2020-07-15 ENCOUNTER — Encounter: Payer: Self-pay | Admitting: Family Medicine

## 2020-07-15 ENCOUNTER — Other Ambulatory Visit: Payer: Self-pay

## 2020-07-15 MED ORDER — SCOPOLAMINE 1 MG/3DAYS TD PT72: MEDICATED_PATCH | TRANSDERMAL | 1 refills | Status: DC

## 2020-09-11 ENCOUNTER — Encounter: Payer: Self-pay | Admitting: Family Medicine

## 2020-10-18 ENCOUNTER — Other Ambulatory Visit: Payer: Self-pay | Admitting: Gastroenterology

## 2020-11-26 ENCOUNTER — Encounter: Payer: Self-pay | Admitting: Family Medicine

## 2020-11-27 MED ORDER — LORAZEPAM 0.5 MG PO TABS: ORAL_TABLET | ORAL | 0 refills | Status: DC

## 2020-11-27 NOTE — Telephone Encounter (Signed)
Requesting: lorazepam Contract: 06/10/18 UDS: n/a Last Visit: 05/17/20 Next Visit: advised to f/u 6 mo Last Refill: 12/17/2(180,1)  Please Advise. Medication pending

## 2020-11-27 NOTE — Telephone Encounter (Signed)
I'll do 30d supply. He is due for f/u for this med.

## 2020-12-12 ENCOUNTER — Encounter: Payer: Self-pay | Admitting: Family Medicine

## 2020-12-12 ENCOUNTER — Telehealth (INDEPENDENT_AMBULATORY_CARE_PROVIDER_SITE_OTHER): Payer: 59 | Admitting: Family Medicine

## 2020-12-12 VITALS — Ht 69.0 in | Wt 215.0 lb

## 2020-12-12 DIAGNOSIS — R1310 Dysphagia, unspecified: Secondary | ICD-10-CM | POA: Diagnosis not present

## 2020-12-12 DIAGNOSIS — F99 Mental disorder, not otherwise specified: Secondary | ICD-10-CM

## 2020-12-12 DIAGNOSIS — I10 Essential (primary) hypertension: Secondary | ICD-10-CM | POA: Diagnosis not present

## 2020-12-12 DIAGNOSIS — F419 Anxiety disorder, unspecified: Secondary | ICD-10-CM

## 2020-12-12 DIAGNOSIS — K219 Gastro-esophageal reflux disease without esophagitis: Secondary | ICD-10-CM

## 2020-12-12 DIAGNOSIS — F5105 Insomnia due to other mental disorder: Secondary | ICD-10-CM

## 2020-12-12 MED ORDER — AMLODIPINE BESYLATE 10 MG PO TABS: 10.0000 mg | ORAL_TABLET | Freq: Every day | ORAL | 1 refills | Status: DC

## 2020-12-12 MED ORDER — LORAZEPAM 0.5 MG PO TABS: ORAL_TABLET | ORAL | 5 refills | Status: DC

## 2020-12-12 NOTE — Progress Notes (Signed)
Virtual Visit via Video Note  I connected with pt on 12/12/20 at  2:30 PM EDT by a video enabled telemedicine application and verified that I am speaking with the correct person using two identifiers.  Location patient: home, [address] Location provider:work or home office Persons participating in the virtual visit: patient, provider  I discussed the limitations of evaluation and management by telemedicine and the availability of in person appointments. The patient expressed understanding and agreed to proceed.  HPI: Patient is a 35 y.o. Caucasian male who presents for 7 mo f/u GAD, HTN vs elev bp w/out HTN, GERD. A/P as of last visit: "1) GAD, does well with long term scheduled use of loraz 0.5mg  bid. Updated CSC today. Erx'd loraz 0.5mg , 1 bid, #180, RF x 1.   2) HTN: NOT on meds. Bp great here today.  One home measurement recalled by pt --diast 90. Need him to check more home bp's. No meds rx'd today.   3) GERD, hx of dysphagia and regurg. Since he has changed diet and lost some wt his sx's are [facility] better controlled with daily PPI. No more dysphagia. EGD was planned by GI but a couple of situations caused cancellation of this procedure. He has to f/u with them but looks like maybe he will not need endoscopy after all. Cont protonix 40mg  qd and good diet/exercise/wt loss."  INTERIM HX: Doing okay but lots of extra stressors lately, esp with work. Sleep is not very good, schedule is off.  No panic.  No depressed mood. Some occ tension HA's. His mind won't shut off at night. Says it does help when he takes 1 tab in daytime, not helping as much with evening dose of 1 tab.  PMP AWARE reviewed today: most recent rx for lorazepam 0.5mg  was filled 11/27/20, # 47, rx by me. No red flags.  BP: has monitored with wrist cuff at home around bedtime, up to 170s when very stress, down to 135/86 when checked earlier today.  GERD: His GER is more problematic lately with inc stress, diet still  pretty good, wt is stable. Taking pantoprazole 40mg  bid.  Now has had some recurrence of dysphagia lately. Some globus sensation and occ cough.    ROS as above, plus--> no fevers, no CP, no SOB, no wheezing, no cough, no dizziness, no HAs, no rashes, no melena/hematochezia.  No polyuria or polydipsia.  No myalgias or arthralgias.  No focal weakness, paresthesias, or tremors.  No acute vision or hearing abnormalities.  No dysuria or unusual/new urinary urgency or frequency.  No recent changes in lower legs. No n/v/d or abd pain.  No palpitations.    Past Medical History:  Diagnosis Date   Allergic rhinitis    Dysplastic nevi    back,thigh,chest (07/2019)   Elevated blood pressure reading without diagnosis of hypertension    Epididymitis 09/2019   ordered scrotal u/s to further eval ? "nodule" pt felt at the time but pt did not return radiology's calls to schedule.   Family history of colon cancer    Uncle with col cancer, and father with adenomatous polyps in his 47s.   Frequent headaches    secondary to chronic anxiety---CT scan brain normal in the past per pt report.   GAD (generalized anxiety disorder)    GERD (gastroesophageal reflux disease)    Irritable bowel syndrome    Obesity, Class I, BMI 30-34.9     Past Surgical History:  Procedure Laterality Date   CLAVICLE SURGERY Left  bone spurs removed   HAIR TRANSPLANT  09/2015   [facility]   labrum repair Left      Current Outpatient Medications:    amLODipine (NORVASC) 10 MG tablet, Take 1 tablet (10 mg total) by mouth daily., Disp: 30 tablet, Rfl: 1   cetirizine (ZYRTEC) 10 MG tablet, Take 10 mg by mouth daily., Disp: , Rfl:    finasteride (PROPECIA) 1 MG tablet, TAKE 1 TABLET BY MOUTH EVERY DAY, Disp: 90 tablet, Rfl: 1   pantoprazole (PROTONIX) 40 MG tablet, TAKE 1 TABLET BY MOUTH TWICE A DAY, Disp: 180 tablet, Rfl: 1   LORazepam (ATIVAN) 0.5 MG tablet, 1 tab po qAM and 2 tab po qPM, Disp: 90 tablet, Rfl: 5   scopolamine  (TRANSDERM-SCOP) 1 MG/3DAYS, APPLY 1 TIME BY TRANSDERMAL ROUTE EVERY 3 DAYS (Patient not taking: Reported on 12/12/2020), Disp: 4 patch, Rfl: 1  EXAM:  VITALS per patient if applicable:  Vitals with BMI 12/12/2020 05/17/2020 10/30/2019  Height 5\' 9"  5\' 9"  5\' 9"   Weight 215 lbs 215 lbs 221 lbs  BMI 31.74 26.94 85.46  Systolic - 270 350  Diastolic - 72 96  Pulse - 67 77     GENERAL: alert, oriented, appears well and in no acute distress  HEENT: atraumatic, conjunttiva clear, no obvious abnormalities on inspection of external nose and ears  NECK: normal movements of the head and neck  LUNGS: on inspection no signs of respiratory distress, breathing rate appears normal, no obvious gross SOB, gasping or wheezing  CV: no obvious cyanosis  MS: moves all visible extremities without noticeable abnormality  PSYCH/NEURO: pleasant and cooperative, no obvious depression or anxiety, speech and thought processing grossly intact  LABS: none today  Lab Results  Component Value Date   TSH 2.20 03/15/2018   Lab Results  Component Value Date   WBC 5.7 03/15/2018   HGB 16.3 03/15/2018   HCT 48.2 03/15/2018   MCV 89.0 03/15/2018   PLT 228.0 03/15/2018   Lab Results  Component Value Date   CREATININE 0.97 03/15/2018   BUN 16 03/15/2018   NA 140 03/15/2018   K 4.4 03/15/2018   CL 101 03/15/2018   CO2 33 (H) 03/15/2018   Lab Results  Component Value Date   ALT 37 03/15/2018   AST 21 03/15/2018   ALKPHOS 45 03/15/2018   BILITOT 0.6 03/15/2018   Lab Results  Component Value Date   CHOL 149 03/15/2018   Lab Results  Component Value Date   HDL 42.00 03/15/2018   Lab Results  Component Value Date   LDLCALC 78 03/15/2018   Lab Results  Component Value Date   TRIG 148.0 03/15/2018   Lab Results  Component Value Date   CHOLHDL 4 03/15/2018    ASSESSMENT AND PLAN:  Discussed the following assessment and plan:  1) HTN, needs to start med. Amlodipine 10mg  qd.   Therapeutic expectations and side effect profile of medication discussed today.  Patient's questions answered. Goal bp <130/80 discussed.  Cont home bp/hr monitoring. Will check cr/lytes at f/u in office in 1 mo.  2) GAD, inc stress lately leading to worse insomnia. Cont alpraz 0.5mg  tab, 1 qam prn and 2 qhs prn. Rx'd #90 tabs with RF x 5 today. CSC renewal when I see him in office in 1 mo.  3) GERD: still pretty symptomatic on pantoprazole 40mg  bid, +dysphagia as well. Will get him back connected with GI -->he was set up to proceed with endoscopy but he had  to cancel (08/2019).  New referral ordered.  I discussed the assessment and treatment plan with the patient. The patient was provided an opportunity to ask questions and all were answered. The patient agreed with the plan and demonstrated an understanding of the instructions.   F/u: 3-4 wks cpe and recheck htn  Signed:  Crissie Sickles, MD           12/12/2020

## 2020-12-25 ENCOUNTER — Other Ambulatory Visit: Payer: Self-pay | Admitting: Family Medicine

## 2020-12-26 ENCOUNTER — Other Ambulatory Visit: Payer: Self-pay | Admitting: Family Medicine

## 2020-12-27 NOTE — Telephone Encounter (Signed)
Requesting: Lorazepam Contract: 06/10/18 UDS: N/A  Last Visit:12/12/20 Next Visit: advised to f/u 3-4 weeks Last Refill:12/12/20(90,5)  Medication has refills available already.

## 2021-01-10 ENCOUNTER — Other Ambulatory Visit: Payer: Self-pay | Admitting: Family Medicine

## 2021-01-10 NOTE — Telephone Encounter (Signed)
Refill request for: Amlodipine 10 mg LR  12/12/20, #30, 1rf LOV  12/12/20 FOV  none scheduled.  Please review and advise.   Thanks.  Dm/cma

## 2021-01-10 NOTE — Telephone Encounter (Signed)
I put 1 RF on the rx I did 7/14 so advise pt he can call pharmacy and get this. Needs f/u in office within the next 1 month ->follow up high blood pressure.-thx

## 2021-01-10 NOTE — Telephone Encounter (Signed)
Spoke to patient, he states that he hasn't been taking the Amlodipine due to his BP has been 126/80. He will call back to schedule a f/u after he gets back in town in 2 weeks.  Dm/cma

## 2021-01-19 ENCOUNTER — Other Ambulatory Visit: Payer: Self-pay | Admitting: Family Medicine

## 2021-03-14 ENCOUNTER — Other Ambulatory Visit: Payer: Self-pay | Admitting: Family Medicine

## 2021-04-12 ENCOUNTER — Other Ambulatory Visit: Payer: Self-pay | Admitting: Family Medicine

## 2021-05-08 ENCOUNTER — Encounter: Payer: Self-pay | Admitting: Gastroenterology

## 2021-05-08 ENCOUNTER — Ambulatory Visit: Payer: 59 | Admitting: Gastroenterology

## 2021-05-08 VITALS — BP 134/82 | HR 75 | Ht 69.0 in | Wt 226.0 lb

## 2021-05-08 DIAGNOSIS — R1310 Dysphagia, unspecified: Secondary | ICD-10-CM

## 2021-05-08 DIAGNOSIS — K219 Gastro-esophageal reflux disease without esophagitis: Secondary | ICD-10-CM

## 2021-05-08 MED ORDER — PANTOPRAZOLE SODIUM 40 MG PO TBEC: 40.0000 mg | DELAYED_RELEASE_TABLET | Freq: Two times a day (BID) | ORAL | 3 refills | Status: DC

## 2021-05-08 NOTE — Progress Notes (Signed)
Referring Provider: Tammi Sou, MD Primary Care Physician:  Tammi Sou, MD  Chief complaint:  Reflux   IMPRESSION:  Reflux with globus and recent bloody regurgitation despite PPI therapy  EGD recommended to evaluate for reflux related complications such as esophagitis, stricture, or Barrett's Esophagus and to evaluate for anatomic considerations such as gastric outlet obstruction or hiatal hernia that may contribute to reflux. He is at risk for eosinophilic esophagitis.   PLAN: - Resume pantoprazole 40 mg BID - Reflux lifestyle modifications - EGD with esophageal and gastric biopsies   HPI: Alec Downs is a 35 y.o. male who returns in follow-up. Last seen 09/05/19 by Alonza Bogus. EGD recommended but cancelled due to Covid.   Reports a lifelong history of reflux. He has a history of environmental allergies. No diagnosis of asthma but he wondered during a recent episode of bronchitis.  Has had progressive symptoms recently. Findings breakthrough heartburn throughout the day. He has globus in the sternum present during and between meals. More prominent when the neck is hyperextended.  Feels like he needs to gag or regurgitate. Brash when he bends over. Intermittent bloody tinge in the regurgitate. Nocturnal cough and choking.  No neck pain, dysphagia, odynophagia, dysphonia.   Sleeps elevated. Has intentionally lost weight over the last yer and how now been working on building muscle mass.  Has eliminated sodas, sweet teas, and caffeine with incomplete relief. Sweets also trigger the symptoms.   Rare use of NSAIDs.   He has been off the pantoprazole for 2 weeks because the prescription ran out.   Sister has lower abdominal pain and an ulcer. Father with reflux, gastric polyps, colon polyps. Paternal grandfather with colon cancer. Maternal uncle with pancreatic cancer. Daughter with asthma.  No other known family history of GI malignancy.  Past Medical History:   Diagnosis Date   Allergic rhinitis    Dysplastic nevi    back,thigh,chest (07/2019)   Elevated blood pressure reading without diagnosis of hypertension    Epididymitis 09/2019   ordered scrotal u/s to further eval ? "nodule" pt felt at the time but pt did not return radiology's calls to schedule.   Family history of colon cancer    Uncle with col cancer, and father with adenomatous polyps in his 69s.   Frequent headaches    secondary to chronic anxiety---CT scan brain normal in the past per pt report.   GAD (generalized anxiety disorder)    GERD (gastroesophageal reflux disease)    Irritable bowel syndrome    Obesity, Class I, BMI 30-34.9     Past Surgical History:  Procedure Laterality Date   CLAVICLE SURGERY Left    bone spurs removed   HAIR TRANSPLANT  09/2015   [facility]   labrum repair Left       Current Outpatient Medications  Medication Sig Dispense Refill   cetirizine (ZYRTEC) 10 MG tablet Take 10 mg by mouth daily.     Esomeprazole Magnesium (NEXIUM PO) Take by mouth. Over the counter     finasteride (PROPECIA) 1 MG tablet Take 1 tablet (1 mg total) by mouth daily. OFFICE VISIT NEEDED FOR FURTHER REFILLS 30 tablet 0   LORazepam (ATIVAN) 0.5 MG tablet 1 tab po qAM and 2 tab po qPM 90 tablet 5   pantoprazole (PROTONIX) 40 MG tablet Take 1 tablet (40 mg total) by mouth 2 (two) times daily. 60 tablet 3   scopolamine (TRANSDERM-SCOP) 1 MG/3DAYS APPLY 1 TIME BY TRANSDERMAL ROUTE EVERY 3  DAYS (Patient taking differently: As needed) 4 patch 1   No current facility-administered medications for this visit.    Allergies as of 05/08/2021 - Review Complete 05/08/2021  Allergen Reaction Noted   Duloxetine Other (See Comments) 03/15/2018   Lisinopril  10/30/2019    Family History  Problem Relation Age of Onset   Hypertension Mother    Arthritis Father    Irritable bowel syndrome Father    Colon polyps Father    Other Father        esophageal polyps ?   Other Sister         GERD   Stroke Maternal Grandmother    Diabetes Maternal Grandmother    Stroke Maternal Grandfather    Diabetes Maternal Grandfather    Skin cancer Maternal Grandfather    Stroke Paternal Grandmother    Aneurysm Paternal Grandmother    Stroke Paternal Grandfather    Colon cancer Paternal Grandfather    Diabetes Maternal Aunt    Pancreatic cancer Maternal Uncle    Diabetes Maternal Uncle       Physical Exam: General:   Alert,  well-nourished, pleasant and cooperative in NAD Head:  Normocephalic and atraumatic. Eyes:  Sclera clear, no icterus.   Conjunctiva pink. Abdomen:  Soft,nontender, nondistended, normal bowel sounds, no rebound or guarding. No hepatosplenomegaly.   Neurologic:  Alert and  oriented x4;  grossly nonfocal Skin:  Intact without significant lesions or rashes. Psych:  Alert and cooperative. Normal mood and affect.   ==   Ayanna Gheen L. Tarri Glenn, MD, MPH 05/08/2021, 7:24 PM

## 2021-05-08 NOTE — Patient Instructions (Addendum)
If you are age 36 or older, your body mass index should be between 23-30. Your Body mass index is 33.37 kg/m. If this is out of the aforementioned range listed, please consider follow up with your Primary Care Provider.  If you are age 101 or younger, your body mass index should be between 19-25. Your Body mass index is 33.37 kg/m. If this is out of the aformentioned range listed, please consider follow up with your Primary Care Provider.   ________________________________________________________  The [facility] providers would like to encourage you to use [facility] to communicate with providers for non-urgent requests or questions.  Due to long hold times on the telephone, sending your provider a message by [facility] may be a faster and more efficient way to get a response.  Please allow 48 business hours for a response.  Please remember that this is for non-urgent requests.  _______________________________________________________  Alec Downs have been scheduled for an endoscopy. Please follow written instructions given to you at your visit today. If you use inhalers (even only as needed), please bring them with you on the day of your procedure.  It was a pleasure to see you today!  Thank you for trusting me with your gastrointestinal care!

## 2021-05-13 ENCOUNTER — Other Ambulatory Visit: Payer: Self-pay

## 2021-05-13 ENCOUNTER — Ambulatory Visit (AMBULATORY_SURGERY_CENTER): Payer: 59 | Admitting: Gastroenterology

## 2021-05-13 ENCOUNTER — Encounter: Payer: Self-pay | Admitting: Gastroenterology

## 2021-05-13 VITALS — BP 138/86 | HR 68 | Temp 98.0°F | Resp 21 | Ht 69.0 in | Wt 226.0 lb

## 2021-05-13 DIAGNOSIS — K317 Polyp of stomach and duodenum: Secondary | ICD-10-CM | POA: Diagnosis not present

## 2021-05-13 DIAGNOSIS — K219 Gastro-esophageal reflux disease without esophagitis: Secondary | ICD-10-CM | POA: Diagnosis present

## 2021-05-13 DIAGNOSIS — K2970 Gastritis, unspecified, without bleeding: Secondary | ICD-10-CM

## 2021-05-13 HISTORY — PX: ESOPHAGOGASTRODUODENOSCOPY: SHX1529

## 2021-05-13 MED ORDER — SODIUM CHLORIDE 0.9 % IV SOLN: 500.0000 mL | Freq: Once | INTRAVENOUS | Status: DC

## 2021-05-13 NOTE — Progress Notes (Signed)
VS by Lynch. 

## 2021-05-13 NOTE — Progress Notes (Signed)
Pt in recovery with monitors in place, VSS. Report given to receiving RN. Bite guard was placed with pt awake to ensure comfort. No dental or soft tissue damage noted. 

## 2021-05-13 NOTE — Op Note (Signed)
[facility] Patient Name: Alec Downs Procedure Date: 05/13/2021 7:34 AM MRN: [id number] Endoscopist: Mallie Mussel L. Loletha Carrow , MD Age: 35 Referring MD:  Date of Birth: [birth year] Gender: Male Account #: [id number] Procedure:                Upper GI endoscopy Indications:              Esophageal reflux symptoms that persist despite                            appropriate therapy Medicines:                Monitored Anesthesia Care Procedure:                Pre-Anesthesia Assessment:                           - Prior to the procedure, a History and Physical                            was performed, and patient medications and                            allergies were reviewed. The patient's tolerance of                            previous anesthesia was also reviewed. The risks                            and benefits of the procedure and the sedation                            options and risks were discussed with the patient.                            All questions were answered, and informed consent                            was obtained. Prior Anticoagulants: The patient has                            taken no previous anticoagulant or antiplatelet                            agents. ASA Grade Assessment: II - A patient with                            mild systemic disease. After reviewing the risks                            and benefits, the patient was deemed in                            satisfactory condition to undergo the procedure.  After obtaining informed consent, the endoscope was                            passed under direct vision. Throughout the                            procedure, the patient's blood pressure, pulse, and                            oxygen saturations were monitored continuously. The                            GIF [id number] #[id number] was introduced through the                            mouth, and advanced to the second  part of duodenum.                            The upper GI endoscopy was accomplished without                            difficulty. The patient tolerated the procedure                            well. Scope In: Scope Out: Findings:                 Normal mucosa was found in the entire esophagus                            (except for small inlet patch proximal). Several                            biopsies were obtained in the lower third of the                            esophagus with cold forceps for histology (r/o                            changes of GERD).                           There is no endoscopic evidence of Barrett's                            esophagus, esophagitis, hiatal hernia, stricture,                            areas of dilation or lower esophageal sphincter                            tone abnormalities in the entire esophagus.                           Multiple small sessile fundic gland polyps  were                            found in the gastric body.                           Inflammation characterized by erosions and erythema                            was found in the gastric antrum. Biopsies were                            taken with a cold forceps for histology(antrum and                            body).                           The exam of the stomach was otherwise normal,                            including on retroflexion. (Hill grade 1)                           The examined duodenum was normal. Complications:            No immediate complications. Estimated Blood Loss:     Estimated blood loss was minimal. Impression:               - Normal mucosa was found in the entire esophagus.                           - Multiple fundic gland polyps.                           - Gastritis. Biopsied.                           - Normal examined duodenum.                           - Several biopsies were obtained in the lower third                            of the  esophagus. Recommendation:           - Patient has a contact number available for                            emergencies. The signs and symptoms of potential                            delayed complications were discussed with the                            patient. Return to normal activities tomorrow.  Written discharge instructions were provided to the                            patient.                           - Resume previous diet.                           - Continue present medications.                           - Await pathology results. Dr. Tarri Glenn to follow up                            with you for more recommendations.                           - Follow an antireflux regimen indefinitely.                            Elevate head of bed (bed wedge) [name], MD 05/13/2021 8:29:16 AM This report has been signed electronically.

## 2021-05-13 NOTE — Patient Instructions (Addendum)
Handouts were given to your care partner on Gastritis and GERD.  GERD handout has antireflux regimen indefinitely.  Elevate head of bed (bed wedge).   You may resume your current medications today. Await biopsy results.  May take 1-3 weeks to receive pathology results.  Dr. Tarri Glenn will follow up with you for more recommendations. Please call if any questions or concerns.      YOU HAD AN ENDOSCOPIC PROCEDURE TODAY AT [facility]:   Refer to the procedure report that was given to you for any specific questions about what was found during the examination.  If the procedure report does not answer your questions, please call your gastroenterologist to clarify.  If you requested that your care partner not be given the details of your procedure findings, then the procedure report has been included in a sealed envelope for you to review at your convenience later.  YOU SHOULD EXPECT: Some feelings of bloating in the abdomen. Passage of more gas than usual.  Walking can help get rid of the air that was put into your GI tract during the procedure and reduce the bloating. If you had a lower endoscopy (such as a colonoscopy or flexible sigmoidoscopy) you may notice spotting of blood in your stool or on the toilet paper. If you underwent a bowel prep for your procedure, you may not have a normal bowel movement for a few days.  Please Note:  You might notice some irritation and congestion in your nose or some drainage.  This is from the oxygen used during your procedure.  There is no need for concern and it should clear up in a day or so.  SYMPTOMS TO REPORT IMMEDIATELY:   Following upper endoscopy (EGD)  Vomiting of blood or coffee ground material  New chest pain or pain under the shoulder blades  Painful or persistently difficult swallowing  New shortness of breath  Fever of 100F or higher  Black, tarry-looking stools  For urgent or emergent issues, a gastroenterologist can be reached  at any hour by calling [phone]. Do not use MyChart messaging for urgent concerns.    DIET:  We do recommend a small meal at first, but then you may proceed to your regular diet.  Drink plenty of fluids but you should avoid alcoholic beverages for 24 hours.  ACTIVITY:  You should plan to take it easy for the rest of today and you should NOT DRIVE or use heavy machinery until tomorrow (because of the sedation medicines used during the test).    FOLLOW UP: Our staff will call the number listed on your records 48-72 hours following your procedure to check on you and address any questions or concerns that you may have regarding the information given to you following your procedure. If we do not reach you, we will leave a message.  We will attempt to reach you two times.  During this call, we will ask if you have developed any symptoms of COVID 19. If you develop any symptoms (ie: fever, flu-like symptoms, shortness of breath, cough etc.) before then, please call [phone].  If you test positive for Covid 19 in the 2 weeks post procedure, please call and report this information to [place].    If any biopsies were taken you will be contacted by phone or by letter within the next 1-3 weeks.  Please call us at [phone] if you have not heard about the biopsies in 3 weeks.    SIGNATURES/CONFIDENTIALITY: You  and/or your care partner have signed paperwork which will be entered into your electronic medical record.  These signatures attest to the fact that that the information above on your After Visit Summary has been reviewed and is understood.  Full responsibility of the confidentiality of this discharge information lies with you and/or your care-partner.

## 2021-05-13 NOTE — Progress Notes (Signed)
No changes to clinical history since GI office visit on 05/08/21. Also dysphagia, so dilation may be necessary.  He was agreeable and new consent signed with me (Dr. Tarri Glenn out sick today). The patient is appropriate for an endoscopic procedure in the ambulatory setting.

## 2021-05-13 NOTE — Progress Notes (Signed)
No problems noted in the recovery room. maw 

## 2021-05-14 ENCOUNTER — Other Ambulatory Visit: Payer: Self-pay | Admitting: Family Medicine

## 2021-05-15 ENCOUNTER — Encounter: Payer: Self-pay | Admitting: Gastroenterology

## 2021-05-15 ENCOUNTER — Telehealth: Payer: Self-pay

## 2021-05-15 NOTE — Telephone Encounter (Signed)
Left message on follow up call. 

## 2021-05-15 NOTE — Telephone Encounter (Signed)
No answer, left message to call if having any issues or concerns, B.Kortlyn Koltz RN 

## 2021-05-19 NOTE — Progress Notes (Signed)
Pt scheduled for f/u 2/1 @ 340pm to discuss results of EGD and to eval efficacy of Pantoprazole.

## 2021-05-26 IMAGING — DX DG ANKLE COMPLETE 3+V*R*
3 series · 3 of 3 positions shown · non-contrast
Comparison: None.

CLINICAL DATA: Fell and rolled right ankle. Right ankle pain and
swelling. Initial encounter.

EXAM:
RIGHT ANKLE - COMPLETE 3+ VIEW

[ankle ap]
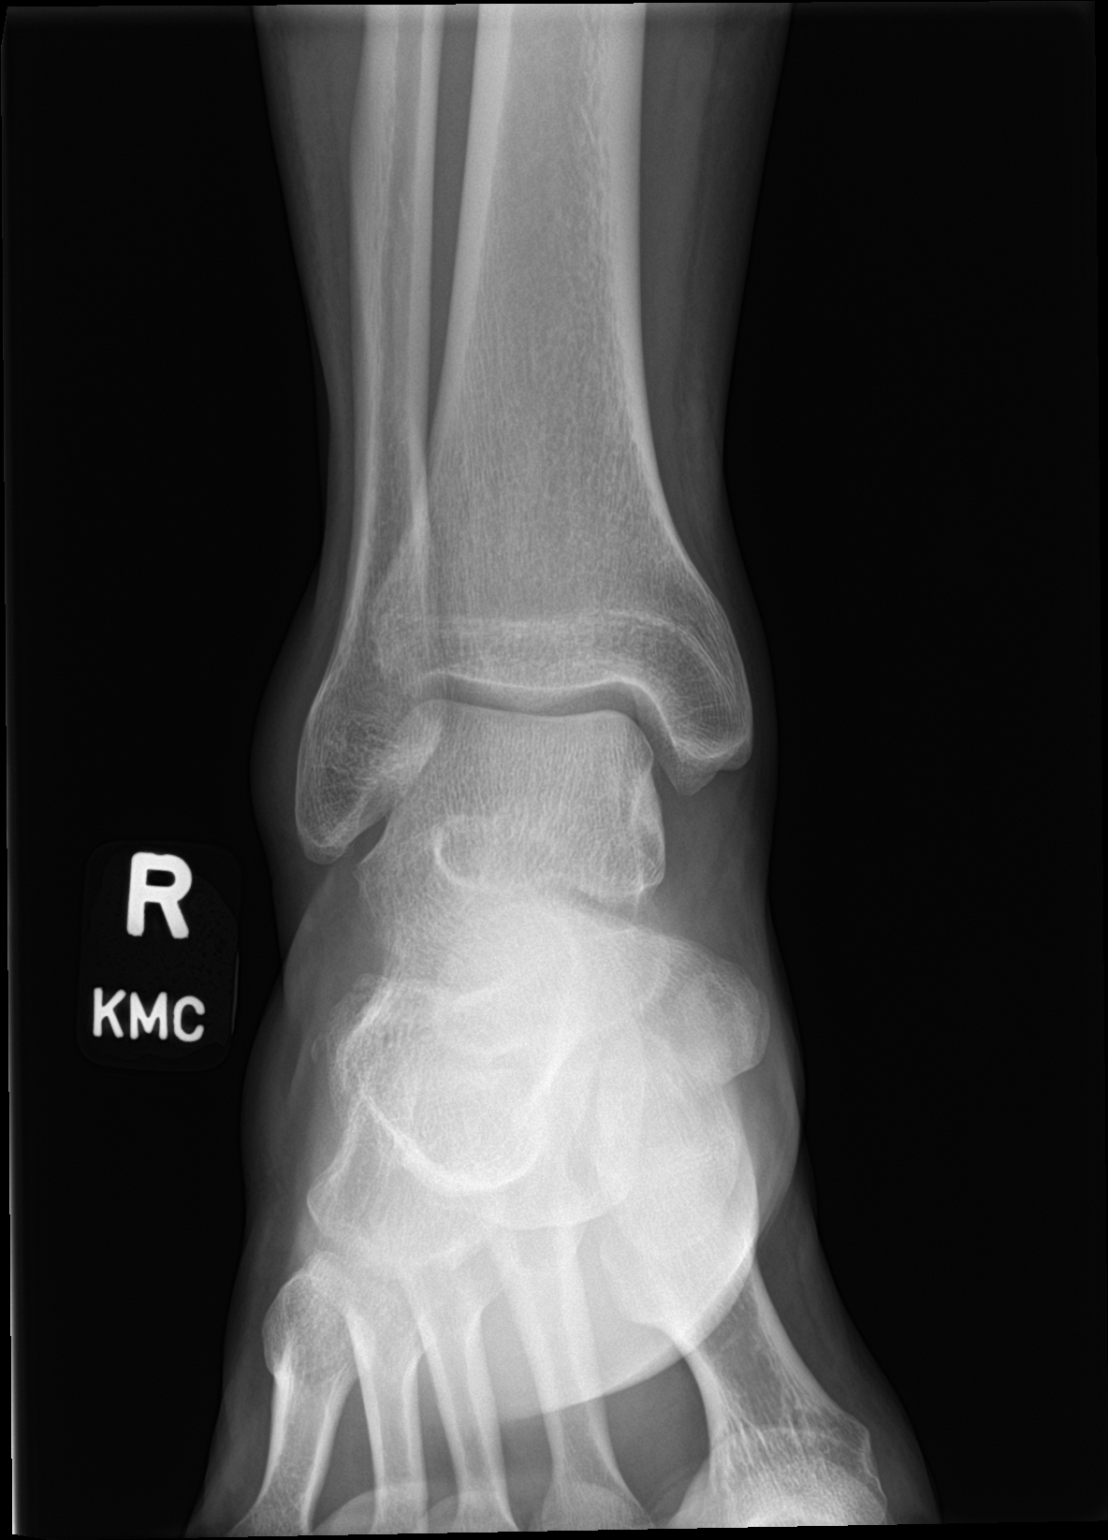

[ankle obl]
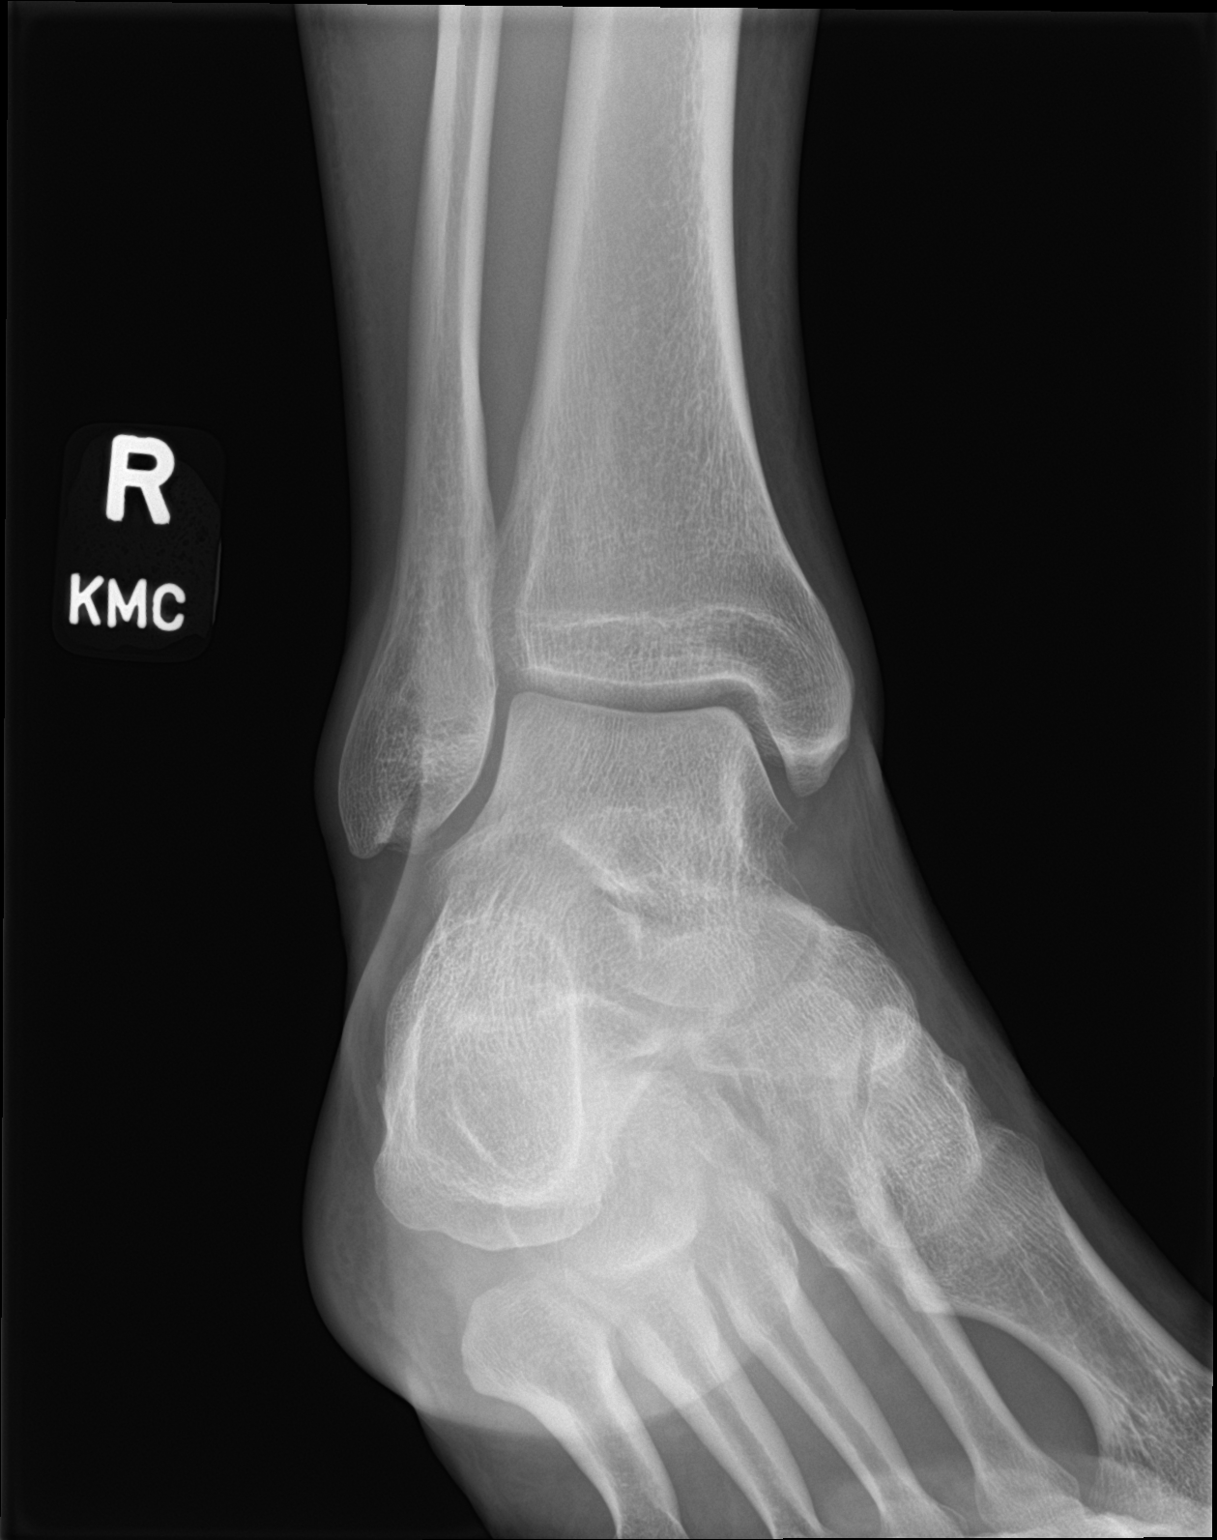

[ankle lat]
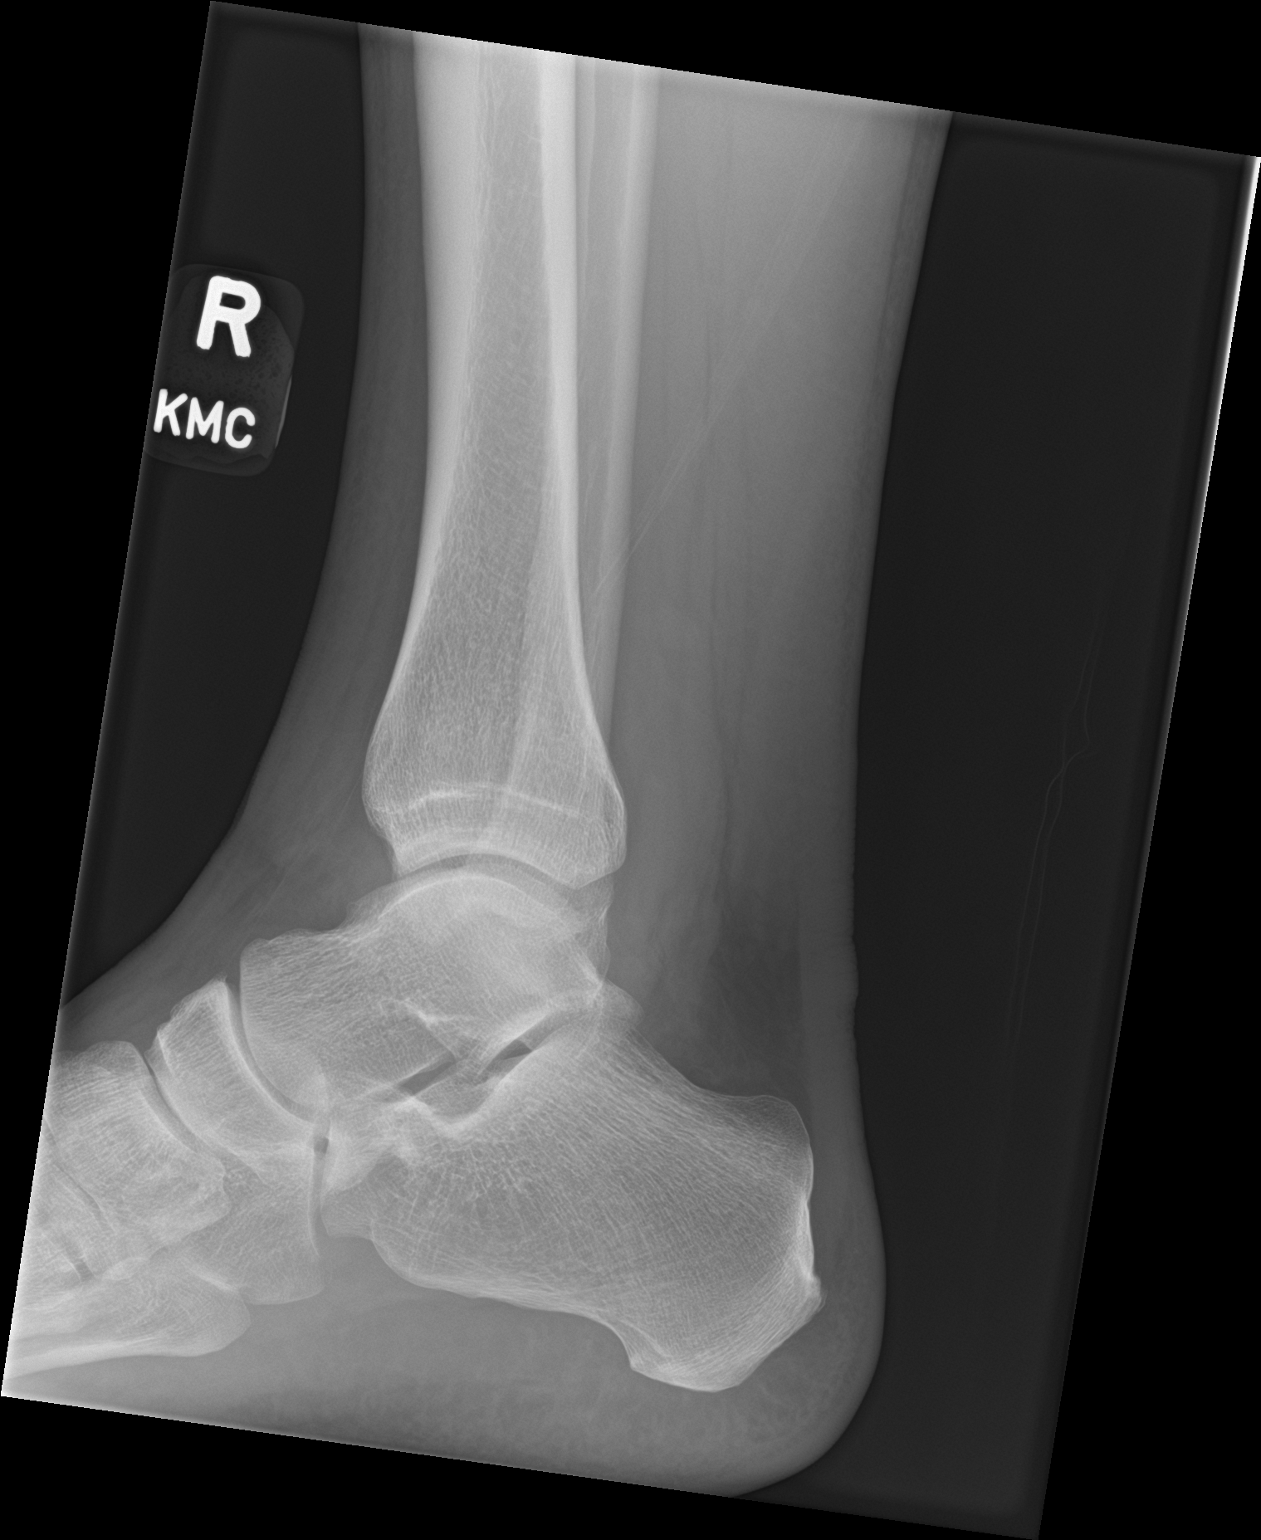

[3 of 3 positions shown; findings below may reference images not displayed]

FINDINGS: There is no evidence of fracture, dislocation, or joint effusion.
There is no evidence of arthropathy or other focal bone abnormality.
Soft tissues are unremarkable.
IMPRESSION: Negative.

## 2021-05-27 ENCOUNTER — Ambulatory Visit: Payer: 59 | Admitting: Family Medicine

## 2021-05-27 ENCOUNTER — Other Ambulatory Visit: Payer: Self-pay

## 2021-05-27 ENCOUNTER — Encounter: Payer: Self-pay | Admitting: Family Medicine

## 2021-05-27 VITALS — BP 130/90 | HR 59 | Temp 97.9°F | Ht 69.0 in | Wt 227.4 lb

## 2021-05-27 DIAGNOSIS — Z79899 Other long term (current) drug therapy: Secondary | ICD-10-CM | POA: Diagnosis not present

## 2021-05-27 DIAGNOSIS — I10 Essential (primary) hypertension: Secondary | ICD-10-CM

## 2021-05-27 DIAGNOSIS — J411 Mucopurulent chronic bronchitis: Secondary | ICD-10-CM | POA: Diagnosis not present

## 2021-05-27 DIAGNOSIS — F411 Generalized anxiety disorder: Secondary | ICD-10-CM

## 2021-05-27 LAB — CBC WITH DIFFERENTIAL/PLATELET
Basophils Absolute: 0 10*3/uL (ref 0.0–0.1)
Basophils Relative: 0.4 % (ref 0.0–3.0)
Eosinophils Absolute: 0.1 10*3/uL (ref 0.0–0.7)
Eosinophils Relative: 2.6 % (ref 0.0–5.0)
HCT: 45.7 % (ref 39.0–52.0)
Hemoglobin: 15.8 g/dL (ref 13.0–17.0)
Lymphocytes Relative: 35.8 % (ref 12.0–46.0)
Lymphs Abs: 1.4 10*3/uL (ref 0.7–4.0)
MCHC: 34.6 g/dL (ref 30.0–36.0)
MCV: 87.9 fl (ref 78.0–100.0)
Monocytes Absolute: 0.4 10*3/uL (ref 0.1–1.0)
Monocytes Relative: 10.6 % (ref 3.0–12.0)
Neutro Abs: 2 10*3/uL (ref 1.4–7.7)
Neutrophils Relative %: 50.6 % (ref 43.0–77.0)
Platelets: 230 10*3/uL (ref 150.0–400.0)
RBC: 5.2 Mil/uL (ref 4.22–5.81)
RDW: 13 % (ref 11.5–15.5)
WBC: 4 10*3/uL (ref 4.0–10.5)

## 2021-05-27 LAB — BASIC METABOLIC PANEL
BUN: 20 mg/dL (ref 6–23)
CO2: 28 mEq/L (ref 19–32)
Calcium: 9.5 mg/dL (ref 8.4–10.5)
Chloride: 102 mEq/L (ref 96–112)
Creatinine, Ser: 1.06 mg/dL (ref 0.40–1.50)
GFR: 90.52 mL/min (ref >60.00)
Glucose, Bld: 96 mg/dL (ref 70–99)
Potassium: 4.2 mEq/L (ref 3.5–5.1)
Sodium: 140 mEq/L (ref 135–145)

## 2021-05-27 MED ORDER — FLUTICASONE PROPIONATE HFA 220 MCG/ACT IN AERO: 2.0000 | INHALATION_SPRAY | Freq: Two times a day (BID) | RESPIRATORY_TRACT | 1 refills | Status: DC

## 2021-05-27 MED ORDER — HYDROCHLOROTHIAZIDE 12.5 MG PO CAPS: 12.5000 mg | ORAL_CAPSULE | Freq: Every day | ORAL | 1 refills | Status: DC

## 2021-05-27 MED ORDER — LORAZEPAM 1 MG PO TABS: ORAL_TABLET | ORAL | 5 refills | Status: DC

## 2021-05-27 NOTE — Progress Notes (Signed)
OFFICE VISIT  05/27/2021  CC: f/u anxiety/med, HTN  HPI:    Patient is a 35 y.o. male who presents for f/u GAD and HTN. I last saw him about 5 months ago (telemed). A/P as of that visit: "1) HTN, needs to start med. Amlodipine 10mg  qd.  Therapeutic expectations and side effect profile of medication discussed today.  Patient's questions answered. Goal bp <130/80 discussed.  Cont home bp/hr monitoring. Will check cr/lytes at f/u in office in 1 mo.   2) GAD, inc stress lately leading to worse insomnia. Cont loraz 0.5mg  tab, 1 qam prn and 2 qhs prn. Rx'd #90 tabs with RF x 5 today. CSC renewal when I see him in office in 1 mo.   3) GERD: still pretty symptomatic on pantoprazole 40mg  bid, +dysphagia as well. Will get him back connected with GI -->he was set up to proceed with endoscopy but he had to cancel (08/2019).  New referral ordered."  INTERIM HX: He did not follow-up as planned after last visit. Alec Downs has been struggling last couple months with increased stress and anxiety.  Having business and financial problems that of been piling up and he has been trying to deal with them all at the same time.  Also has a lawsuit going.  Has trouble focusing/concentrating, trouble completing tasks, not sleeping.  Denies depressed mood.  Stress eating.  Almost gets into a panic at times, but nothing unprovoked. Not drinking or doing drugs.  GERD: he had an EGD on 05/13/21 that showed some antral gastritis, o/w normal.  Bx's benign/neg.  He took the amlodipine I prescribed last visit for only 1 to 2 days.  It made him feel a little bit woozy.  Has had about 4 episodes of bronchitis over the last year, seemed to start after having COVID in 2021. Has been to urgent care on multiple occasions for this, says chest x-ray showed bronchitis there but no pneumonia.  No hemoptysis or fever.  He does not smoke cigarettes. Has had several antibiotics, none of which did much good.  Most recent episode  resulted in prescription for prednisone and he says this did help.  He is also been prescribed a albuterol inhaler. Currently having mild coughing.  Denies chest tightness or wheezing.  He has maintained his normal good exercise habits through all of this.  PMP AWARE reviewed today: most recent rx for lorazepam was filled 04/26/21, # 96, rx by me. No red flags.  Past Medical History:  Diagnosis Date   Allergic rhinitis    Dysplastic nevi    back,thigh,chest (07/2019)   Epididymitis 09/2019   ordered scrotal u/s to further eval ? "nodule" pt felt at the time but pt did not return radiology's calls to schedule.   Essential hypertension    Family history of colon cancer    Uncle with col cancer, and father with adenomatous polyps in his 45s.   Frequent headaches    secondary to chronic anxiety---CT scan brain normal in the past per pt report.   GAD (generalized anxiety disorder)    GERD (gastroesophageal reflux disease)    Irritable bowel syndrome    Obesity, Class I, BMI 30-34.9     Past Surgical History:  Procedure Laterality Date   CLAVICLE SURGERY Left    bone spurs removed   ESOPHAGOGASTRODUODENOSCOPY  05/13/2021   antral gastritis, o/w normal.   HAIR TRANSPLANT  09/2015   [facility]   labrum repair Left     Outpatient Medications Prior to  Visit  Medication Sig Dispense Refill   cetirizine (ZYRTEC) 10 MG tablet Take 10 mg by mouth daily.     finasteride (PROPECIA) 1 MG tablet TAKE 1 TABLET BY MOUTH DAILY. OFFICE VISIT NEEDED FOR FURTHER REFILLS 30 tablet 0   pantoprazole (PROTONIX) 40 MG tablet Take 1 tablet (40 mg total) by mouth 2 (two) times daily. 60 tablet 3   LORazepam (ATIVAN) 0.5 MG tablet 1 tab po qAM and 2 tab po qPM 90 tablet 5   scopolamine (TRANSDERM-SCOP) 1 MG/3DAYS APPLY 1 TIME BY TRANSDERMAL ROUTE EVERY 3 DAYS (Patient not taking: Reported on 05/27/2021) 4 patch 1   Esomeprazole Magnesium (NEXIUM PO) Take by mouth. Over the counter (Patient not taking: Reported  on 05/27/2021)     No facility-administered medications prior to visit.    Allergies  Allergen Reactions   Duloxetine Other (See Comments)    Shaky, paranoid     Lisinopril     ROS As per HPI  PE: Vitals with BMI 05/27/2021 05/27/2021 05/13/2021  Height - 5\' 9"  -  Weight - 227 lbs 6 oz -  BMI - 25.85 -  Systolic 277 824 235  Diastolic 90 91 86  Pulse - 59 68     Physical Exam  Gen: Alert, well appearing.  Patient is oriented to person, place, time, and situation. AFFECT: pleasant, lucid thought and speech. CV: RRR, no m/r/g.   LUNGS: CTA bilat, nonlabored resps, good aeration in all lung fields. EXT: no clubbing or cyanosis.  no edema.    LABS:  Last CBC Lab Results  Component Value Date   WBC 5.7 03/15/2018   HGB 16.3 03/15/2018   HCT 48.2 03/15/2018   MCV 89.0 03/15/2018   RDW 12.9 03/15/2018   PLT 228.0 36/14/4315   Last metabolic panel Lab Results  Component Value Date   GLUCOSE 97 03/15/2018   NA 140 03/15/2018   K 4.4 03/15/2018   CL 101 03/15/2018   CO2 33 (H) 03/15/2018   BUN 16 03/15/2018   CREATININE 0.97 03/15/2018   CALCIUM 10.0 03/15/2018   PROT 7.4 03/15/2018   ALBUMIN 4.9 03/15/2018   BILITOT 0.6 03/15/2018   ALKPHOS 45 03/15/2018   AST 21 03/15/2018   ALT 37 03/15/2018   Last lipids Lab Results  Component Value Date   CHOL 149 03/15/2018   HDL 42.00 03/15/2018   LDLCALC 78 03/15/2018   TRIG 148.0 03/15/2018   CHOLHDL 4 03/15/2018   Last hemoglobin A1c No results found for: HGBA1C Last thyroid functions Lab Results  Component Value Date   TSH 2.20 03/15/2018   IMPRESSION AND PLAN:  #1 generalized anxiety disorder, worse due to increased life stress the last 2 months.  No longer responding to lorazepam at the 0.5 mg dose.  I think an increase in lorazepam to 1 mg dose every 8 hours as needed would be the next step.  I do not think daily antidepressant is indicated at this time, plus he felt "funny" on cymbalta trial in  the past. Controlled substance contract updated. UDS today.  #2 hypertension.  Did not tolerate amlodipine or lisinopril. We will do trial of HCTZ 12.5 mg a day. Electrolytes and creatinine today.  3.  Recurrent bronchitis, possible asthma. Will start trial of daily Flovent to 20 mcg, 2 puffs twice a day. He has albuterol inhaler to use 4 times daily as needed. Check CBC today.  4. GERD: he had an EGD on 05/13/21 that showed some antral gastritis,  o/w normal.  Bx's benign/neg. Continue pantoprazole 40 mg twice a day. He has GI follow-up soon.  An After Visit Summary was printed and given to the patient.  FOLLOW UP: Return in about 4 weeks (around 06/24/2021) for f/u anxiety/asthma/HTN.  Signed:  Crissie Sickles, MD           05/27/2021

## 2021-05-29 LAB — DRUG MONITORING PANEL 376104, URINE
Alphahydroxyalprazolam: NEGATIVE ng/mL (ref <25)
Alphahydroxymidazolam: NEGATIVE ng/mL (ref <50)
Alphahydroxytriazolam: NEGATIVE ng/mL (ref <50)
Aminoclonazepam: NEGATIVE ng/mL (ref <25)
Amphetamines: NEGATIVE ng/mL (ref <500)
Barbiturates: NEGATIVE ng/mL (ref <300)
Benzodiazepines: POSITIVE ng/mL — AB (ref <100)
Cocaine Metabolite: NEGATIVE ng/mL (ref <150)
Desmethyltramadol: NEGATIVE ng/mL (ref <100)
Hydroxyethylflurazepam: NEGATIVE ng/mL (ref <50)
Lorazepam: 781 ng/mL — ABNORMAL HIGH (ref <50)
Nordiazepam: NEGATIVE ng/mL (ref <50)
Opiates: NEGATIVE ng/mL (ref <100)
Oxazepam: NEGATIVE ng/mL (ref <50)
Oxycodone: NEGATIVE ng/mL (ref <100)
Temazepam: NEGATIVE ng/mL (ref <50)
Tramadol: NEGATIVE ng/mL (ref <100)

## 2021-05-29 LAB — DM TEMPLATE

## 2021-06-19 ENCOUNTER — Other Ambulatory Visit: Payer: Self-pay | Admitting: Family Medicine

## 2021-06-24 ENCOUNTER — Ambulatory Visit: Payer: 59 | Admitting: Family Medicine

## 2021-06-24 NOTE — Progress Notes (Deleted)
OFFICE VISIT  06/24/2021  CC: No chief complaint on file.   HPI:    Patient is a 36 y.o. male who presents for 1 mo f/u HTN, asthma, anxiety. A/P as of last visit: "#1 generalized anxiety disorder, worse due to increased life stress the last 2 months.  No longer responding to lorazepam at the 0.5 mg dose.  I think an increase in lorazepam to 1 mg dose every 8 hours as needed would be the next step.  I do not think daily antidepressant is indicated at this time, plus he felt "funny" on cymbalta trial in the past. Controlled substance contract updated. UDS today.   #2 hypertension.  Did not tolerate amlodipine or lisinopril. We will do trial of HCTZ 12.5 mg a day. Electrolytes and creatinine today.  3.  Recurrent bronchitis, possible asthma. Will start trial of daily Flovent to 20 mcg, 2 puffs twice a day. He has albuterol inhaler to use 4 times daily as needed. Check CBC today.   4. GERD: he had an EGD on 05/13/21 that showed some antral gastritis, o/w normal.  Bx's benign/neg. Continue pantoprazole 40 mg twice a day. He has GI follow-up soon."  INTERIM HX: CBC and be met normal last visit.  Urine tox screen showed appropriate results. ***   PMP AWARE reviewed today: most recent rx for lorazepam 71m was filled 05/27/21, # 970 rx by me. No red flags.  Past Medical History:  Diagnosis Date   Allergic rhinitis    Dysplastic nevi    back,thigh,chest (07/2019)   Epididymitis 09/2019   ordered scrotal u/s to further eval ? "nodule" pt felt at the time but pt did not return radiology's calls to schedule.   Essential hypertension    Family history of colon cancer    Uncle with col cancer, and father with adenomatous polyps in his 428s   Frequent headaches    secondary to chronic anxiety---CT scan brain normal in the past per pt report.   GAD (generalized anxiety disorder)    GERD (gastroesophageal reflux disease)    Irritable bowel syndrome    Obesity, Class I, BMI 30-34.9      Past Surgical History:  Procedure Laterality Date   CLAVICLE SURGERY Left    bone spurs removed   ESOPHAGOGASTRODUODENOSCOPY  05/13/2021   antral gastritis, o/w normal.   HAIR TRANSPLANT  09/2015   [facility]   labrum repair Left     Outpatient Medications Prior to Visit  Medication Sig Dispense Refill   cetirizine (ZYRTEC) 10 MG tablet Take 10 mg by mouth daily.     finasteride (PROPECIA) 1 MG tablet TAKE 1 TABLET BY MOUTH DAILY. OFFICE VISIT NEEDED FOR FURTHER REFILLS 30 tablet 0   fluticasone (FLOVENT HFA) 220 MCG/ACT inhaler Inhale 2 puffs into the lungs 2 (two) times daily. 1 each 1   hydrochlorothiazide (MICROZIDE) 12.5 MG capsule Take 1 capsule (12.5 mg total) by mouth daily. 30 capsule 1   LORazepam (ATIVAN) 1 MG tablet 1 tab po q8h prn anxiety 90 tablet 5   pantoprazole (PROTONIX) 40 MG tablet Take 1 tablet (40 mg total) by mouth 2 (two) times daily. 60 tablet 3   scopolamine (TRANSDERM-SCOP) 1 MG/3DAYS APPLY 1 TIME BY TRANSDERMAL ROUTE EVERY 3 DAYS (Patient not taking: Reported on 05/27/2021) 4 patch 1   No facility-administered medications prior to visit.    Allergies  Allergen Reactions   Duloxetine Other (See Comments)    Shaky, paranoid     Lisinopril  ROS As per HPI  PE: Vitals with BMI 05/27/2021 05/27/2021 05/13/2021  Height - 5' 9"  -  Weight - 227 lbs 6 oz -  BMI - 37.36 -  Systolic 681 594 707  Diastolic 90 91 86  Pulse - 59 68     Physical Exam  ***  LABS:  Last CBC Lab Results  Component Value Date   WBC 4.0 05/27/2021   HGB 15.8 05/27/2021   HCT 45.7 05/27/2021   MCV 87.9 05/27/2021   RDW 13.0 05/27/2021   PLT 230.0 61/51/8343   Last metabolic panel Lab Results  Component Value Date   GLUCOSE 96 05/27/2021   NA 140 05/27/2021   K 4.2 05/27/2021   CL 102 05/27/2021   CO2 28 05/27/2021   BUN 20 05/27/2021   CREATININE 1.06 05/27/2021   CALCIUM 9.5 05/27/2021   PROT 7.4 03/15/2018   ALBUMIN 4.9 03/15/2018   BILITOT 0.6  03/15/2018   ALKPHOS 45 03/15/2018   AST 21 03/15/2018   ALT 37 03/15/2018   Last thyroid functions Lab Results  Component Value Date   TSH 2.20 03/15/2018      IMPRESSION AND PLAN:  No problem-specific Assessment & Plan notes found for this encounter.   An After Visit Summary was printed and given to the patient.  FOLLOW UP: No follow-ups on file.  Signed:  Crissie Sickles, MD           06/24/2021

## 2021-07-01 ENCOUNTER — Encounter: Payer: Self-pay | Admitting: Family Medicine

## 2021-07-01 ENCOUNTER — Other Ambulatory Visit: Payer: Self-pay

## 2021-07-01 MED ORDER — SCOPOLAMINE 1 MG/3DAYS TD PT72: MEDICATED_PATCH | TRANSDERMAL | 0 refills | Status: DC

## 2021-07-02 ENCOUNTER — Other Ambulatory Visit: Payer: Self-pay | Admitting: Family Medicine

## 2021-07-02 ENCOUNTER — Encounter: Payer: Self-pay | Admitting: Gastroenterology

## 2021-07-02 ENCOUNTER — Ambulatory Visit (INDEPENDENT_AMBULATORY_CARE_PROVIDER_SITE_OTHER): Payer: BC Managed Care – PPO | Admitting: Gastroenterology

## 2021-07-02 VITALS — BP 130/90 | HR 62 | Ht 69.0 in | Wt 231.0 lb

## 2021-07-02 DIAGNOSIS — K219 Gastro-esophageal reflux disease without esophagitis: Secondary | ICD-10-CM | POA: Diagnosis not present

## 2021-07-02 MED ORDER — FAMOTIDINE 20 MG PO TABS: 20.0000 mg | ORAL_TABLET | Freq: Two times a day (BID) | ORAL | 1 refills | Status: DC

## 2021-07-02 MED ORDER — SUCRALFATE 1 G PO TABS: 1.0000 g | ORAL_TABLET | Freq: Three times a day (TID) | ORAL | 0 refills | Status: DC

## 2021-07-02 NOTE — Patient Instructions (Addendum)
It was a pleasure to see you today. I'm sorry that you aren't feeling better.  I would like to continue pantoprazole 40 mg twice daily.   I recommend that you start taking famotidine 20 mg twice daily. This has been sent to your pharmacy.  If you continue to have symptoms despite these changes you could use Carafate four times daily to try to make things feel better. This has also been sent to the pharmacy.  Please let me know how you are feeling after a couple of months of combination therapy. If you still aren't feeling better, I would recommend additional testing. Please touch base with me by the weekend of the Final Four to let me know how you are feeling.   I will ask Dr. Anitra Lauth about evaluation for sleep apnea given his concerns and the suggestion of the endoscopic team.   Given your family history, I recommend that you start colon cancer screening at age 13.    BMI:  If you are age 68 or older, your body mass index should be between 23-30. Your Body mass index is 34.11 kg/m. If this is out of the aforementioned range listed, please consider follow up with your Primary Care Provider.  If you are age 58 or younger, your body mass index should be between 19-25. Your Body mass index is 34.11 kg/m. If this is out of the aformentioned range listed, please consider follow up with your Primary Care Provider.   MY CHART:  [facility] providers would like to encourage you to use [facility] to communicate with providers for non-urgent requests or questions.  Due to long hold times on the telephone, sending your provider a message by [facility] may be a faster and more efficient way to get a response.  Please allow 48 business hours for a response.  Please remember that this is for non-urgent requests.   Thank you for trusting me with your gastrointestinal care!    Thornton Park, MD, MPH

## 2021-07-02 NOTE — Progress Notes (Signed)
Referring Provider: Tammi Sou, MD Primary Care Physician:  Tammi Sou, MD  Chief complaint:  Reflux   IMPRESSION:  Non-erosive reflux with atypical chest pain despite PPI therapy BID. Biopsies negative for EOE and reflux.  Gastric biopsies also confirm gastropathy.    PLAN: - Continue pantoprazole 40 mg BID - Add famotidine 20 mg BID - Add Carafate 1 gram slurry QID prn - Avoid NSAIDs - No alcohol - Will discuss OSA testing with Dr. Anitra Lauth  - Follow-up by MyChart message in 6-8 weeks, earlier if needed - Start colon cancer screening at age 24   HPI: Alec Downs is a 36 y.o. male who returns in follow-up after endoscopic evaluation.  He has a lifelong history of reflux with progressive symptoms despite PPI therapy. Findings breakthrough heartburn throughout the day. He has globus in the sternum present during and between meals. Often mimics chest pain. Brash when he bends over. Nocturnal cough and choking despite sleeping with a wedge pillow. Has intentionally lost weight over the last yer and how now been working on building muscle mass.  Has eliminated sodas, sweet teas, and caffeine with incomplete relief. Sweets also trigger the symptoms. Rare use of NSAIDs.   Switch to pantoprazole from Nexium provided about 2 weeks of relief. But symptoms have been daily and unrelenting since that time.  Wife has reported increasing snoring and wonders if he needs a sleep study.   He had an  EGD with Dr. Loletha Carrow 05/13/21 was endoscopically normal except for fundic gland polyps and gastric erosions. Esophageal biopsies were normal. Gastric biopsies show gastropathy.   He returns today in follow-up. Symptoms are unchanged but he notes that he has been in PPI BID for almost 2 years and nothing else has been changed.  Sister has lower abdominal pain and an ulcer. Father with reflux, gastric polyps, colon polyps first diagnosed at age 80. His father has had multiple  colonoscopies due to the number of polyps removed.  Paternal grandfather with colon cancer. Maternal uncle with pancreatic cancer. Daughter with asthma.  No other known family history of GI malignancy.  Past Medical History:  Diagnosis Date   Allergic rhinitis    Dysplastic nevi    back,thigh,chest (07/2019)   Epididymitis 09/2019   ordered scrotal u/s to further eval ? "nodule" pt felt at the time but pt did not return radiology's calls to schedule.   Essential hypertension    Family history of colon cancer    Uncle with col cancer, and father with adenomatous polyps in his 57s.   Frequent headaches    secondary to chronic anxiety---CT scan brain normal in the past per pt report.   GAD (generalized anxiety disorder)    GERD (gastroesophageal reflux disease)    Irritable bowel syndrome    Obesity, Class I, BMI 30-34.9     Past Surgical History:  Procedure Laterality Date   CLAVICLE SURGERY Left    bone spurs removed   ESOPHAGOGASTRODUODENOSCOPY  05/13/2021   antral gastritis, o/w normal.   HAIR TRANSPLANT  09/2015   [facility]   labrum repair Left       Current Outpatient Medications  Medication Sig Dispense Refill   cetirizine (ZYRTEC) 10 MG tablet Take 10 mg by mouth daily.     famotidine (PEPCID) 20 MG tablet Take 1 tablet (20 mg total) by mouth 2 (two) times daily. 60 tablet 1   finasteride (PROPECIA) 1 MG tablet TAKE 1 TABLET BY MOUTH DAILY. OFFICE VISIT  NEEDED FOR FURTHER REFILLS 30 tablet 0   fluticasone (FLOVENT HFA) 220 MCG/ACT inhaler Inhale 2 puffs into the lungs 2 (two) times daily. 1 each 1   hydrochlorothiazide (MICROZIDE) 12.5 MG capsule Take 1 capsule (12.5 mg total) by mouth daily. 30 capsule 1   LORazepam (ATIVAN) 1 MG tablet 1 tab po q8h prn anxiety 90 tablet 5   pantoprazole (PROTONIX) 40 MG tablet Take 1 tablet (40 mg total) by mouth 2 (two) times daily. 60 tablet 3   scopolamine (TRANSDERM-SCOP) 1 MG/3DAYS APPLY 1 TIME BY TRANSDERMAL ROUTE EVERY 3 DAYS 4  patch 0   sucralfate (CARAFATE) 1 g tablet Take 1 tablet (1 g total) by mouth 4 (four) times daily -  with meals and at bedtime. 120 tablet 0   No current facility-administered medications for this visit.    Allergies as of 07/02/2021 - Review Complete 07/02/2021  Allergen Reaction Noted   Duloxetine Other (See Comments) 03/15/2018   Lisinopril  10/30/2019    Family History  Problem Relation Age of Onset   Hypertension Mother    Arthritis Father    Irritable bowel syndrome Father    Colon polyps Father    Other Father        esophageal polyps ?   Other Sister        GERD   Diabetes Maternal Aunt    Pancreatic cancer Maternal Uncle    Diabetes Maternal Uncle    Stroke Maternal Grandmother    Diabetes Maternal Grandmother    Stroke Maternal Grandfather    Diabetes Maternal Grandfather    Skin cancer Maternal Grandfather    Stroke Paternal Grandmother    Aneurysm Paternal Grandmother    Stroke Paternal Grandfather    Colon cancer Paternal Grandfather    Stomach cancer Neg Hx    Esophageal cancer Neg Hx       Physical Exam: General:   Alert,  well-nourished, pleasant and cooperative in NAD Head:  Normocephalic and atraumatic. Eyes:  Sclera clear, no icterus.   Conjunctiva pink. Abdomen:  Soft, nontender, nondistended, normal bowel sounds, no rebound or guarding. No hepatosplenomegaly.   Neurologic:  Alert and  oriented x4;  grossly nonfocal Skin:  Intact without significant lesions or rashes. Psych:  Alert and cooperative. Normal mood and affect.    Perri Aragones L. Tarri Glenn, MD, MPH 07/02/2021, 10:01 PM

## 2021-07-22 IMAGING — US US SCROTUM W/ DOPPLER COMPLETE
1 series · 14 of 25 positions shown · non-contrast
Comparison: None

CLINICAL DATA: LEFT testicular nodule and swelling in for
early/posteriorly, accidentally struck by a knee to groin

EXAM:
SCROTAL ULTRASOUND
DOPPLER ULTRASOUND OF THE TESTICLES
TECHNIQUE: Complete ultrasound examination of the testicles, epididymis, and
other scrotal structures was performed. Color and spectral Doppler
ultrasound were also utilized to evaluate blood flow to the
testicles.

[Series 1: us scrotum w/ doppler complete · 37 acquisitions, 14 frames shown]
[im 1/37]
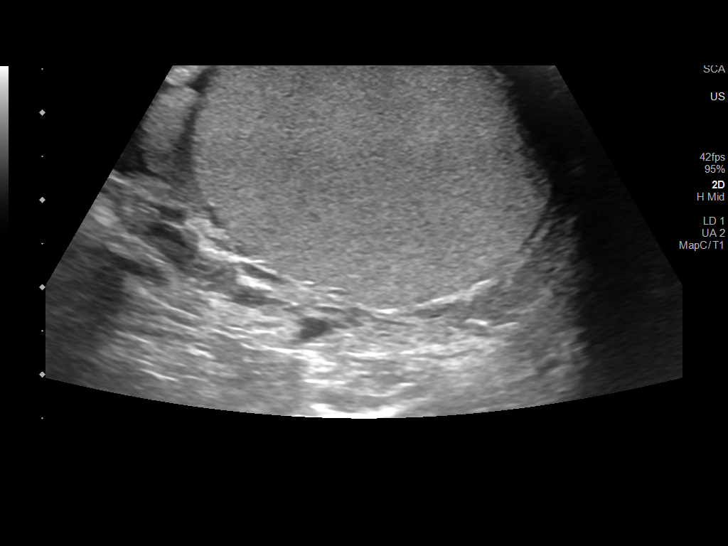
[im 4/37]
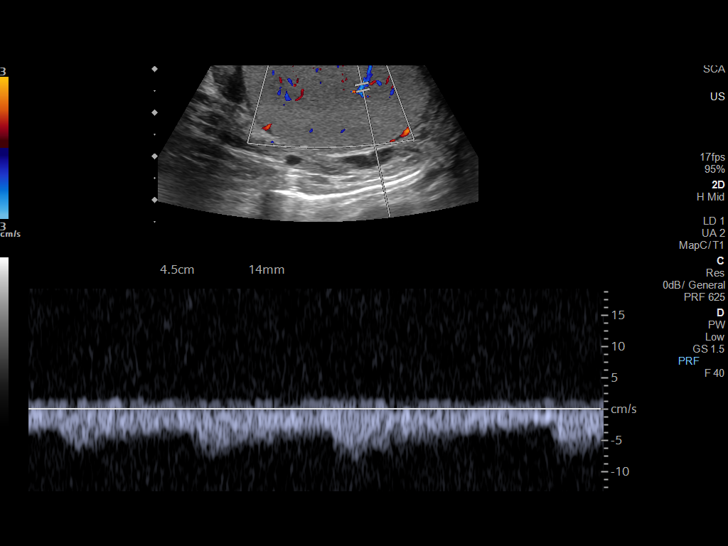
[im 7/37]
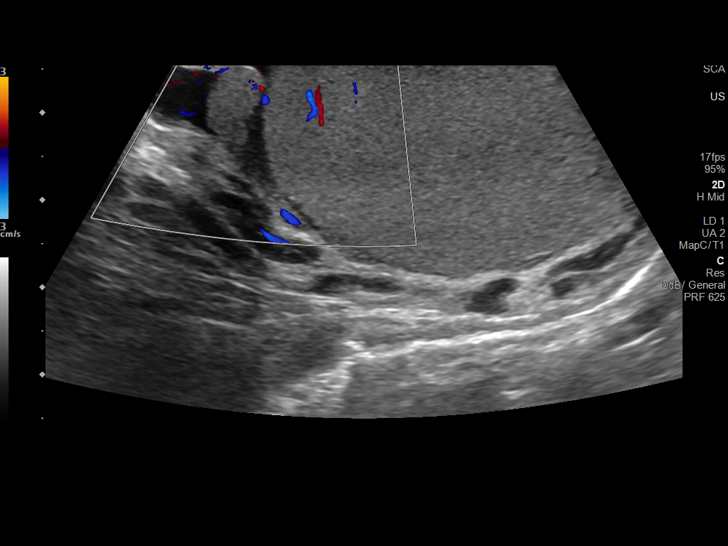
[im 10/37]
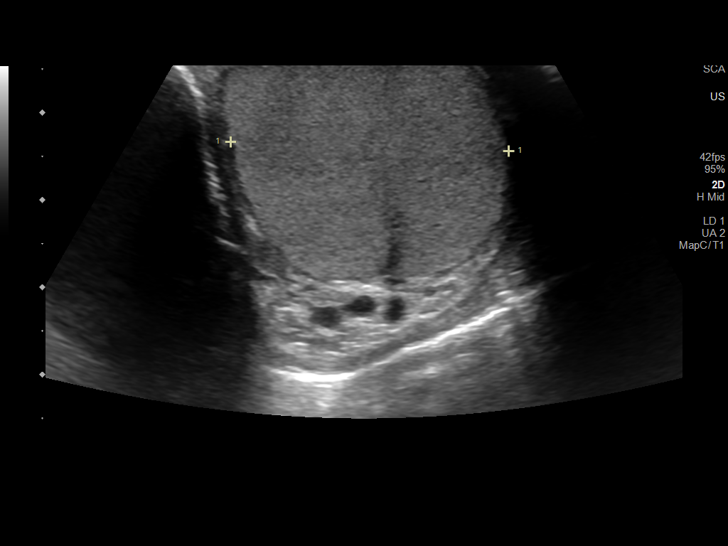
[im 13/37]
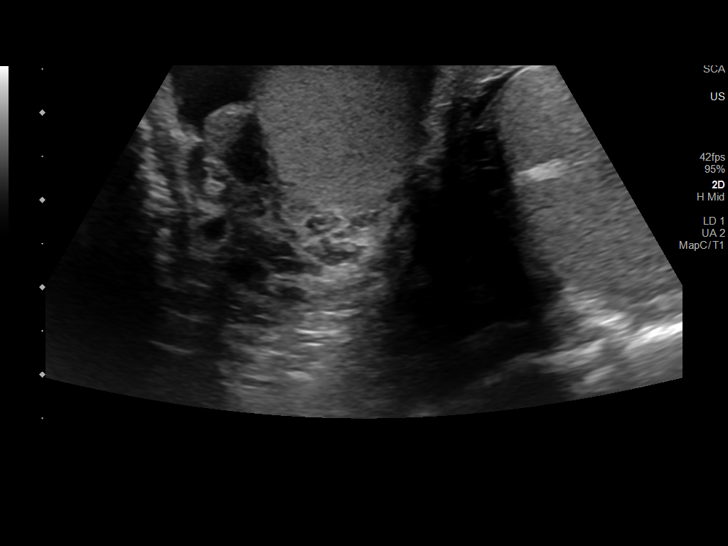
[im 14/37]
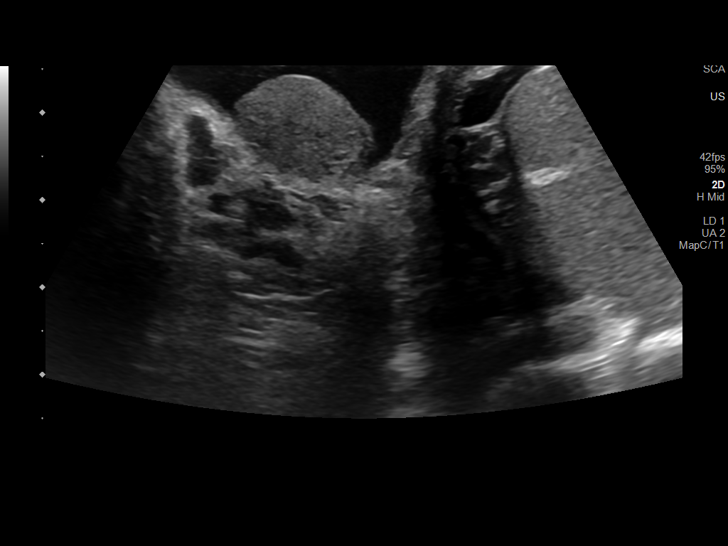
[im 17/37]
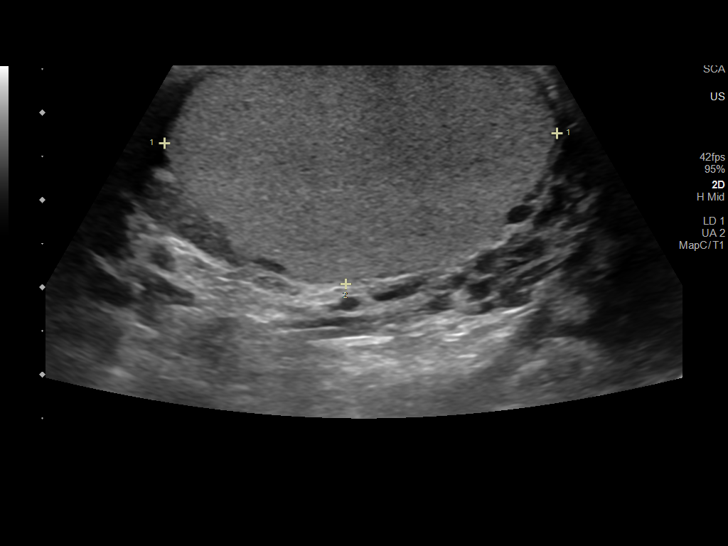
[im 20/37]
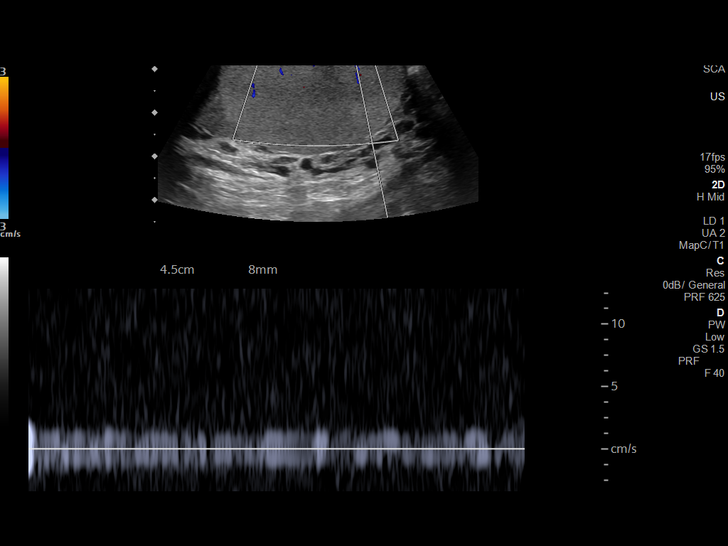
[im 23/37]
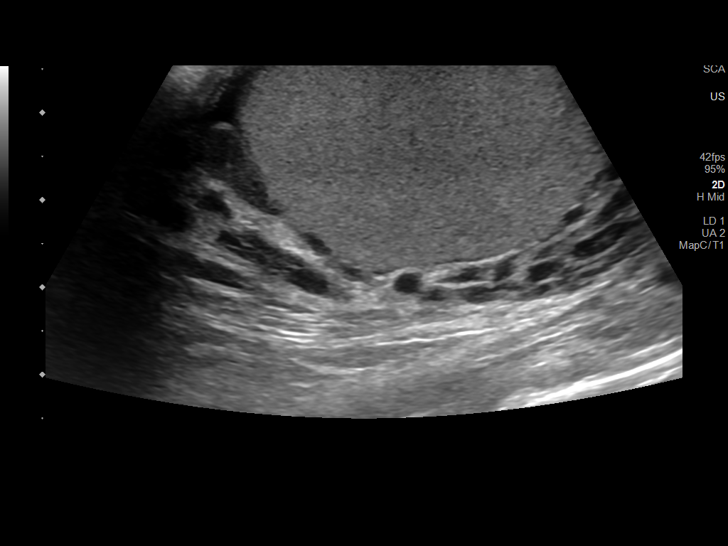
[im 25/37]
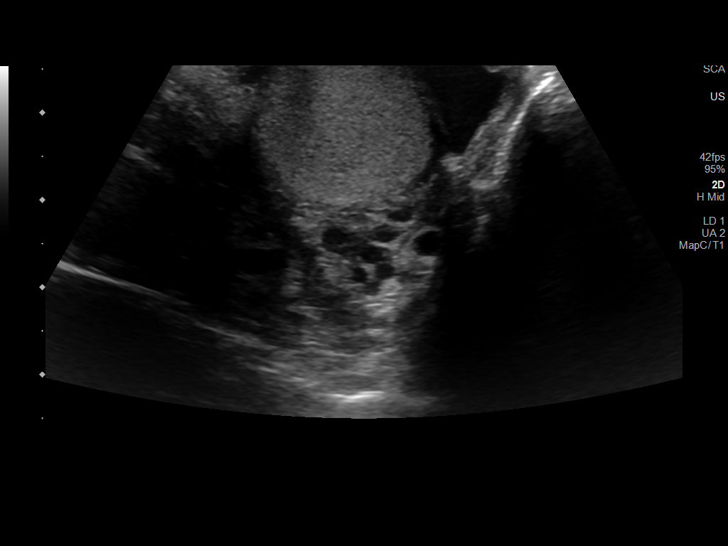
[im 28/37]
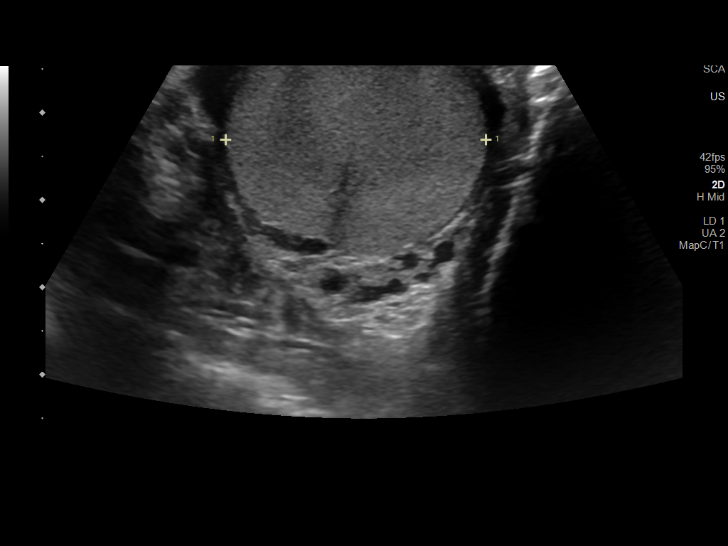
[im 31/37]
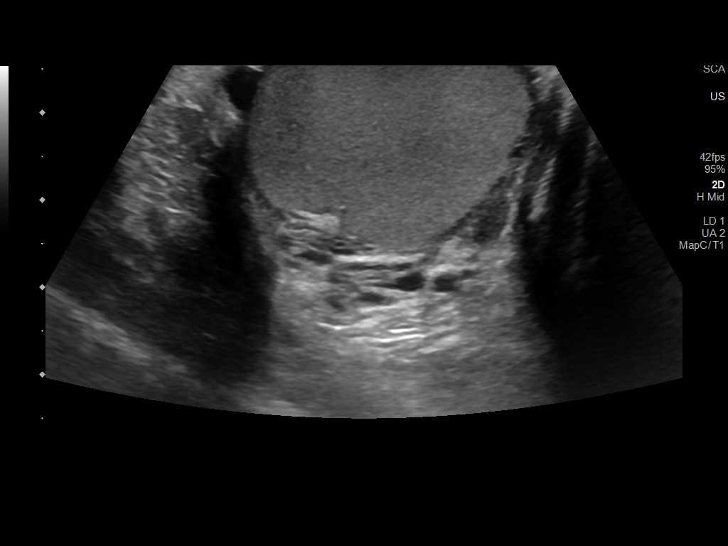
[im 34/37]
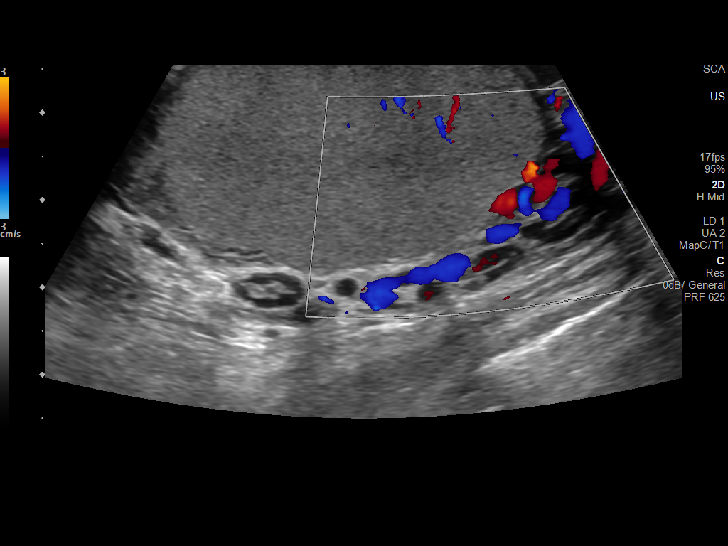
[im 37/37]
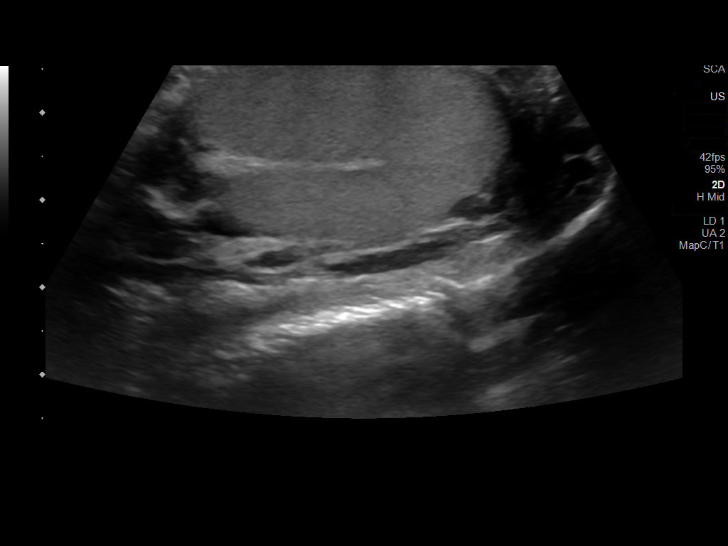

[14 of 25 positions shown; findings below may reference images not displayed]

FINDINGS: Right testicle

Measurements: 4.2 x 2.9 x 3.2 cm. Normal echogenicity without mass
or calcification. Internal blood flow present on color Doppler
imaging.

Left testicle

Measurements: 4.5 x 2.6 x 3.0 cm. Normal echogenicity without mass
or calcification. Internal blood flow present on color Doppler
imaging.

Right epididymis:  Normal in size and appearance.

Left epididymis:  Normal in size and appearance.

Hydrocele:  Small BILATERAL hydroceles

Varicocele:  Small LEFT varicocele.

Pulsed Doppler interrogation of both testes demonstrates normal low
resistance arterial and venous waveforms bilaterally.
IMPRESSION: Small BILATERAL hydroceles and small LEFT varicocele.

Remainder of exam unremarkable.

## 2021-07-30 ENCOUNTER — Other Ambulatory Visit: Payer: Self-pay | Admitting: Family Medicine

## 2021-07-30 ENCOUNTER — Other Ambulatory Visit: Payer: Self-pay | Admitting: Gastroenterology

## 2021-08-05 ENCOUNTER — Other Ambulatory Visit: Payer: Self-pay | Admitting: Gastroenterology

## 2021-08-11 ENCOUNTER — Telehealth: Payer: Self-pay | Admitting: *Deleted

## 2021-08-11 NOTE — Telephone Encounter (Signed)
PA submitted and approved via Covermymeds for Pantoprazole. Faxed approval to pharmacy.  ?

## 2021-08-17 ENCOUNTER — Other Ambulatory Visit: Payer: Self-pay | Admitting: Family Medicine

## 2021-08-21 ENCOUNTER — Other Ambulatory Visit: Payer: Self-pay

## 2021-08-21 ENCOUNTER — Ambulatory Visit (INDEPENDENT_AMBULATORY_CARE_PROVIDER_SITE_OTHER): Payer: BC Managed Care – PPO | Admitting: Family Medicine

## 2021-08-21 ENCOUNTER — Encounter: Payer: Self-pay | Admitting: Family Medicine

## 2021-08-21 VITALS — BP 131/76 | HR 74 | Temp 97.4°F | Ht 69.0 in | Wt 235.2 lb

## 2021-08-21 DIAGNOSIS — Z79899 Other long term (current) drug therapy: Secondary | ICD-10-CM

## 2021-08-21 DIAGNOSIS — F411 Generalized anxiety disorder: Secondary | ICD-10-CM

## 2021-08-21 DIAGNOSIS — R0683 Snoring: Secondary | ICD-10-CM | POA: Diagnosis not present

## 2021-08-21 DIAGNOSIS — G4719 Other hypersomnia: Secondary | ICD-10-CM

## 2021-08-21 DIAGNOSIS — J309 Allergic rhinitis, unspecified: Secondary | ICD-10-CM

## 2021-08-21 DIAGNOSIS — I10 Essential (primary) hypertension: Secondary | ICD-10-CM

## 2021-08-21 DIAGNOSIS — J4530 Mild persistent asthma, uncomplicated: Secondary | ICD-10-CM

## 2021-08-21 DIAGNOSIS — G4733 Obstructive sleep apnea (adult) (pediatric): Secondary | ICD-10-CM | POA: Diagnosis not present

## 2021-08-21 MED ORDER — FINASTERIDE 1 MG PO TABS: ORAL_TABLET | ORAL | 1 refills | Status: DC

## 2021-08-21 MED ORDER — LORAZEPAM 1 MG PO TABS: ORAL_TABLET | ORAL | 5 refills | Status: DC

## 2021-08-21 MED ORDER — FLUTICASONE PROPIONATE HFA 220 MCG/ACT IN AERO: 2.0000 | INHALATION_SPRAY | Freq: Two times a day (BID) | RESPIRATORY_TRACT | 1 refills | Status: DC

## 2021-08-21 MED ORDER — HYDROCHLOROTHIAZIDE 12.5 MG PO CAPS: 12.5000 mg | ORAL_CAPSULE | Freq: Every day | ORAL | 1 refills | Status: DC

## 2021-08-21 NOTE — Progress Notes (Signed)
OFFICE VISIT ? ?08/21/2021 ? ?CC:  ?Chief Complaint  ?Patient presents with  ? Anxiety  ? Hypertension  ? ?HPI:   ? ?Patient is a 36 y.o. male who presents for 10-monthfollow-up anxiety and hypertension. ?A/P as of last visit: ?"#1 generalized anxiety disorder, worse due to increased life stress the last 2 months.  No longer responding to lorazepam at the 0.5 mg dose.  I think an increase in lorazepam to 1 mg dose every 8 hours as needed would be the next step.  I do not think daily antidepressant is indicated at this time, plus he felt "funny" on cymbalta trial in the past. ?Controlled substance contract updated. ?UDS today. ?  ?#2 hypertension.  Did not tolerate amlodipine or lisinopril. ?We will do trial of HCTZ 12.5 mg a day. ?Electrolytes and creatinine today. ? ?3.  Recurrent bronchitis, possible asthma. ?Will start trial of daily Flovent to 20 mcg, 2 puffs twice a day. ?He has albuterol inhaler to use 4 times daily as needed. ?Check CBC today. ?  ?4. GERD: he had an EGD on 05/13/21 that showed some antral gastritis, o/w normal.  Bx's benign/neg. ?Continue pantoprazole 40 mg twice a day. ?He has GI follow-up soon. ? ?INTERIM HX: ?Says lorazepam 1 mg 3 times daily dosing is working very well. ?Still dealing with pretty consistent nasal congestion, postnasal drip, coughing/wheezing off-and-on.  He did not get to start the Flovent because the pharmacy did not have it in stock, patient asked for prescription to be resent. ? ?He snores very loudly.  Wife recorded him on the phone and he plays it for me today and is loud snoring with no apnea.  Wife does say he has apneic periods though. ?He does feel excessively sleepy and worn out all day. ?However, he pushes himself to exercise daily with a trainer.  Feels like he is eating healthy but continues to gradually gain weight. ? ?PMP AWARE reviewed today: most recent rx for lorazepam was filled 07/11/21, # [id number] rx by me. ?No red flags. ? ? ?Past Medical History:   ?Diagnosis Date  ? Allergic rhinitis   ? Dysplastic nevi   ? back,thigh,chest (07/2019)  ? Epididymitis 09/2019  ? ordered scrotal u/s to further eval ? "nodule" pt felt at the time but pt did not return radiology's calls to schedule.  ? Essential hypertension   ? Family history of colon cancer   ? Uncle with col cancer, and father with adenomatous polyps in his 467s  ? Frequent headaches   ? secondary to chronic anxiety---CT scan brain normal in the past per pt report.  ? GAD (generalized anxiety disorder)   ? GERD (gastroesophageal reflux disease)   ? Irritable bowel syndrome   ? Obesity, Class I, BMI 30-34.9   ? ? ?Past Surgical History:  ?Procedure Laterality Date  ? CLAVICLE SURGERY Left   ? bone spurs removed  ? ESOPHAGOGASTRODUODENOSCOPY  05/13/2021  ? antral gastritis, o/w normal.  ? HAIR TRANSPLANT  09/2015  ? [place]  ? labrum repair Left   ? ? ?Outpatient Medications Prior to Visit  ?Medication Sig Dispense Refill  ? cetirizine (ZYRTEC) 10 MG tablet Take 10 mg by mouth daily.    ? famotidine (PEPCID) 20 MG tablet TAKE 1 TABLET BY MOUTH TWICE A DAY 60 tablet 11  ? pantoprazole (PROTONIX) 40 MG tablet Take 1 tablet (40 mg total) by mouth 2 (two) times daily. 180 tablet 3  ? sucralfate (CARAFATE) 1 g tablet Take 1  tablet (1 g total) by mouth 4 (four) times daily -  with meals and at bedtime. 120 tablet 0  ? scopolamine (TRANSDERM-SCOP) 1 MG/3DAYS APPLY 1 TIME BY TRANSDERMAL ROUTE EVERY 3 DAYS (Patient not taking: Reported on 08/21/2021) 4 patch 0  ? finasteride (PROPECIA) 1 MG tablet TAKE 1 TABLET BY MOUTH DAILY. OFFICE VISIT NEEDED FOR FURTHER REFILLS 30 tablet 0  ? fluticasone (FLOVENT HFA) 220 MCG/ACT inhaler Inhale 2 puffs into the lungs 2 (two) times daily. (Patient not taking: Reported on 08/21/2021) 1 each 1  ? hydrochlorothiazide (MICROZIDE) 12.5 MG capsule Take 1 capsule (12.5 mg total) by mouth daily. (Patient not taking: Reported on 08/21/2021) 30 capsule 1  ? LORazepam (ATIVAN) 1 MG tablet 1 tab po  q8h prn anxiety 90 tablet 5  ? ?No facility-administered medications prior to visit.  ? ? ?Allergies  ?Allergen Reactions  ? Duloxetine Other (See Comments)  ?  Shaky, paranoid    ? Lisinopril   ? ? ?ROS ?As per HPI ? ?PE: ? ?  08/21/2021  ?  2:58 PM 07/02/2021  ?  3:49 PM 05/27/2021  ? 10:47 AM  ?Vitals with BMI  ?Height '5\' 9"'$  '5\' 9"'$    ?Weight 235 lbs 3 oz 231 lbs   ?BMI 34.72 34.1   ?Systolic 343 568 616  ?Diastolic 76 90 90  ?Pulse 74 62   ? ? ? ?Physical Exam ? ?Gen: Alert, well appearing.  Patient is oriented to person, place, time, and situation. ?AFFECT: pleasant, lucid thought and speech. ?No further exam today. ? ?LABS:  ?Last CBC ?Lab Results  ?Component Value Date  ? WBC 4.0 05/27/2021  ? HGB 15.8 05/27/2021  ? HCT 45.7 05/27/2021  ? MCV 87.9 05/27/2021  ? RDW 13.0 05/27/2021  ? PLT 230.0 05/27/2021  ? ?Last metabolic panel ?Lab Results  ?Component Value Date  ? GLUCOSE 96 05/27/2021  ? NA 140 05/27/2021  ? K 4.2 05/27/2021  ? CL 102 05/27/2021  ? CO2 28 05/27/2021  ? BUN 20 05/27/2021  ? CREATININE 1.06 05/27/2021  ? CALCIUM 9.5 05/27/2021  ? PROT 7.4 03/15/2018  ? ALBUMIN 4.9 03/15/2018  ? BILITOT 0.6 03/15/2018  ? ALKPHOS 45 03/15/2018  ? AST 21 03/15/2018  ? ALT 37 03/15/2018  ? ?IMPRESSION AND PLAN: ? ?#1 obstructive sleep apnea suspected. ?We will refer to sleep specialist today. ?He does have some chronic nasal congestion that may be playing a role.  Holding off on ENT referral at this time. ? ?2.  Chronic anxiety. ?He is doing well on lorazepam 1 mg 3 times daily and he is using this appropriately. ?Urine drug screen with appropriate results December 2022. ?Controlled substance contract updated today. ? ?#3 hypertension.  Continue HCTZ 12.5 mg a day. ? ?#4 large rhinitis with suspected mild persistent asthma. ?Start flovent to 20 mcg, 2 puffs twice daily. ?Continue daily Flonase and Zyrtec. ? ?An After Visit Summary was printed and given to the patient. ? ?FOLLOW UP: Return in about 6 months (around  02/21/2022) for annual CPE (fasting). ? ?Signed:  Crissie Sickles, MD           08/21/2021 ? ? ? ?

## 2021-10-13 ENCOUNTER — Encounter: Payer: Self-pay | Admitting: Neurology

## 2021-10-13 ENCOUNTER — Ambulatory Visit (INDEPENDENT_AMBULATORY_CARE_PROVIDER_SITE_OTHER): Payer: BC Managed Care – PPO | Admitting: Neurology

## 2021-10-13 VITALS — BP 127/82 | HR 44 | Ht 69.0 in | Wt 238.0 lb

## 2021-10-13 DIAGNOSIS — E669 Obesity, unspecified: Secondary | ICD-10-CM | POA: Diagnosis not present

## 2021-10-13 DIAGNOSIS — R519 Headache, unspecified: Secondary | ICD-10-CM

## 2021-10-13 DIAGNOSIS — Z9189 Other specified personal risk factors, not elsewhere classified: Secondary | ICD-10-CM

## 2021-10-13 DIAGNOSIS — R635 Abnormal weight gain: Secondary | ICD-10-CM

## 2021-10-13 DIAGNOSIS — G4719 Other hypersomnia: Secondary | ICD-10-CM | POA: Diagnosis not present

## 2021-10-13 DIAGNOSIS — K219 Gastro-esophageal reflux disease without esophagitis: Secondary | ICD-10-CM

## 2021-10-13 DIAGNOSIS — R0683 Snoring: Secondary | ICD-10-CM

## 2021-10-13 DIAGNOSIS — R0681 Apnea, not elsewhere classified: Secondary | ICD-10-CM | POA: Diagnosis not present

## 2021-10-13 NOTE — Patient Instructions (Signed)

## 2021-10-13 NOTE — Progress Notes (Signed)
Subjective:  ?  ?Patient ID: Alec Downs is a 36 y.o. male. ? ?HPI ? ? ? ?Alec Age, MD, PhD ?[facility] ?[address], [address] ?[phone] ?[place], [place] [address] ? ?Dear Dr. Anitra Downs,  ? ?I saw your patient, Alec Downs, upon your kind request in my sleep clinic today for initial consultation of his sleep disorder, in particular, concern for underlying obstructive sleep apnea.  The patient is unaccompanied today.  As you know, Alec Downs is a 36 year old right-handed gentleman with an underlying medical history of allergic rhinitis, hypertension, reflux disease, irritable bowel syndrome, anxiety and obesity, who reports snoring and excessive daytime somnolence as well as witnessed apneas per wife's report.  I reviewed your office note from 08/21/2021.  His Epworth sleepiness score is 14 out of 24, fatigue severity score is 16 out of 63.  He lives with his wife and 2 young children, ages 64 and 29, both girls and they are expecting their third child any day now, a boy.  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  He has had weight gain in the realm of 30 pounds in the past 6 months.  He goes to the gym regularly and also lifts weight.  He is a non-smoker and drinks alcohol very occasionally, on the weekends.  Caffeine is limited to 1 espresso per day in the mornings.  He has a history of bruxism and uses a dentist made bite guard for the past several years.  He has occasional morning headaches, occasional nocturia, not nightly.  He is not aware of any family history of sleep apnea.  They have 2 dogs in the household and 1 hairless.  Bedtime is generally around 10:30 PM and rise time around 6:15 AM.  He does not wake up rested.  He has not woken up well rested in quite some time, probably a few years.  Symptoms have become worse in the past several months.  He has nocturnal reflux symptoms including regurgitation.  He had a recent upper endoscopy  done and was encouraged to be evaluated for sleep apnea due to witnessed apneas noted. ? ? ?His Past Medical History Is Significant For: ?Past Medical History:  ?Diagnosis Date  ? Allergic rhinitis   ? Dysplastic nevi   ? back,thigh,chest (07/2019)  ? Epididymitis 09/2019  ? ordered scrotal u/s to further eval ? "nodule" pt felt at the time but pt did not return radiology's calls to schedule.  ? Essential hypertension   ? Family history of colon cancer   ? Uncle with col cancer, and father with adenomatous polyps in his 36s.  ? Frequent headaches   ? secondary to chronic anxiety---CT scan brain normal in the past per pt report.  ? GAD (generalized anxiety disorder)   ? GERD (gastroesophageal reflux disease)   ? Irritable bowel syndrome   ? Obesity, Class I, BMI 30-34.9   ? ? ?His Past Surgical History Is Significant For: ?Past Surgical History:  ?Procedure Laterality Date  ? CLAVICLE SURGERY Left   ? bone spurs removed  ? ESOPHAGOGASTRODUODENOSCOPY  05/13/2021  ? antral gastritis, o/w normal.  ? HAIR TRANSPLANT  09/2015  ? [place]  ? labrum repair Left   ? ? ?His Family History Is Significant For: ?Family History  ?Problem Relation Downs of Onset  ? Hypertension Mother   ? Arthritis Father   ? Irritable bowel syndrome Father   ? Colon polyps Father   ? Other Father   ?  esophageal polyps ?  ? Other Sister   ?     GERD  ? Diabetes Maternal Aunt   ? Pancreatic cancer Maternal Uncle   ? Diabetes Maternal Uncle   ? Stroke Maternal Grandmother   ? Diabetes Maternal Grandmother   ? Stroke Maternal Grandfather   ? Diabetes Maternal Grandfather   ? Skin cancer Maternal Grandfather   ? Stroke Paternal Grandmother   ? Aneurysm Paternal Grandmother   ? Stroke Paternal Grandfather   ? Colon cancer Paternal Grandfather   ? Stomach cancer Neg Hx   ? Esophageal cancer Neg Hx   ? Sleep apnea Neg Hx   ? ? ?His Social History Is Significant For: ?Social History  ? ?Socioeconomic History  ? Marital status: Married  ?  Spouse name: Not  on file  ? Number of children: Not on file  ? Years of education: Not on file  ? Highest education level: Not on file  ?Occupational History  ? Not on file  ?Tobacco Use  ? Smoking status: Never  ? Smokeless tobacco: Never  ?Vaping Use  ? Vaping Use: Not on file  ?Substance and Sexual Activity  ? Alcohol use: Yes  ?  Alcohol/week: 2.0 standard drinks  ?  Types: 2 Glasses of wine per week  ?  Comment: OCC  ? Drug use: No  ? Sexual activity: Yes  ?  Birth control/protection: None  ?  Comment: married  ?Other Topics Concern  ? Not on file  ?Social History Narrative  ? Married, 2 daughters (as of 03/2018).  ? Educ: BA  ? 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  ? No T/A/Ds.  ? ?Social Determinants of Health  ? ?Financial Resource Strain: Not on file  ?Food Insecurity: Not on file  ?Transportation Needs: Not on file  ?Physical Activity: Not on file  ?Stress: Not on file  ?Social Connections: Not on file  ? ? ?His Allergies Are:  ?Allergies  ?Allergen Reactions  ? Duloxetine Other (See Comments)  ?  Shaky, paranoid    ? Lisinopril   ?:  ? ?His Current Medications Are:  ?Outpatient Encounter Medications as of 10/13/2021  ?Medication Sig  ? cetirizine (ZYRTEC) 10 MG tablet Take 10 mg by mouth daily.  ? famotidine (PEPCID) 20 MG tablet TAKE 1 TABLET BY MOUTH TWICE A DAY  ? finasteride (PROPECIA) 1 MG tablet TAKE 1 TABLET BY MOUTH DAILY.  ? fluticasone (FLOVENT HFA) 220 MCG/ACT inhaler Inhale 2 puffs into the lungs 2 (two) times daily. (Patient taking differently: Inhale 2 puffs into the lungs as needed.)  ? hydrochlorothiazide (MICROZIDE) 12.5 MG capsule Take 1 capsule (12.5 mg total) by mouth daily.  ? LORazepam (ATIVAN) 1 MG tablet 1 tab po q8h prn anxiety  ? pantoprazole (PROTONIX) 40 MG tablet Take 1 tablet (40 mg total) by mouth 2 (two) times daily.  ? scopolamine (TRANSDERM-SCOP) 1 MG/3DAYS APPLY 1 TIME BY TRANSDERMAL ROUTE EVERY 3 DAYS (Patient taking differently: as needed. APPLY 1 TIME BY TRANSDERMAL ROUTE  EVERY 3 DAYS)  ? sucralfate (CARAFATE) 1 g tablet Take 1 tablet (1 g total) by mouth 4 (four) times daily -  with meals and at bedtime. (Patient taking differently: Take 1 g by mouth as needed.)  ? ?No facility-administered encounter medications on file as of 10/13/2021.  ?: ? ? ?Review of Systems:  ?Out of a complete 14 point review of systems, all are reviewed and negative with the exception of these symptoms as listed below: ? ? ?  Review of Systems  ?Neurological:   ?     Pt is here for sleep consult Pt states he snores,fatigue, headaches, hypertension pt denies sleep study and CPAP machine  ? ?ESS:14 ?FSS:60 ? ?  ? ?Objective:  ?Neurological Exam ? ?Physical Exam ?Physical Examination:  ? ?Vitals:  ? 10/13/21 0908  ?BP: 127/82  ?Pulse: (!) 44  ? ? ?General Examination: The patient is a very pleasant 36 y.o. male in no acute distress. He appears well-developed and well-nourished and well groomed.  ? ?HEENT: Normocephalic, atraumatic, pupils are equal, round and reactive to light, extraocular tracking is good without limitation to gaze excursion or nystagmus noted. Hearing is grossly intact. Face is symmetric with normal facial animation. Speech is clear with no dysarthria noted. There is no hypophonia. There is no lip, neck/head, jaw or voice tremor. Neck is supple with full range of passive and active motion. There are no carotid bruits on auscultation. Oropharynx exam reveals: No significant mouth dryness, good dental hygiene, moderate airway crowding secondary to tonsillar size of about 1+ and longer uvula.  He has a minimal overbite.  Mallampati class II.  Neck circumference of 17 and three-quarter inches.  Tongue protrudes centrally and palate elevates symmetrically.  ? ?Chest: Clear to auscultation without wheezing, rhonchi or crackles noted. ? ?Heart: S1+S2+0, regular and normal without murmurs, rubs or gallops noted.  ? ?Abdomen: Soft, non-tender and non-distended. ? ?Extremities: There is no pitting edema  in the distal lower extremities bilaterally.  ? ?Skin: Warm and dry without trophic changes noted.  ? ?Musculoskeletal: exam reveals no obvious joint deformities.  ? ?Neurologically:  ?Mental status: T

## 2021-10-16 DIAGNOSIS — Z3009 Encounter for other general counseling and advice on contraception: Secondary | ICD-10-CM | POA: Diagnosis not present

## 2021-10-28 ENCOUNTER — Encounter: Payer: Self-pay | Admitting: Neurology

## 2021-10-30 DIAGNOSIS — G4733 Obstructive sleep apnea (adult) (pediatric): Secondary | ICD-10-CM

## 2021-10-30 HISTORY — DX: Obstructive sleep apnea (adult) (pediatric): G47.33

## 2021-11-05 DIAGNOSIS — Z302 Encounter for sterilization: Secondary | ICD-10-CM | POA: Diagnosis not present

## 2021-11-10 ENCOUNTER — Other Ambulatory Visit: Payer: Self-pay | Admitting: Family Medicine

## 2021-11-10 MED ORDER — SCOPOLAMINE 1 MG/3DAYS TD PT72: MEDICATED_PATCH | TRANSDERMAL | 0 refills | Status: DC

## 2021-11-10 NOTE — Telephone Encounter (Signed)
Patient called back he is scheduled at [facility] to pick up for 11/25/21 for 2:30 pm. Lorella Nimrod auth: [id number] (exp. 10/28/21 to 12/26/21)

## 2021-11-17 DIAGNOSIS — M9903 Segmental and somatic dysfunction of lumbar region: Secondary | ICD-10-CM | POA: Diagnosis not present

## 2021-11-17 DIAGNOSIS — M9901 Segmental and somatic dysfunction of cervical region: Secondary | ICD-10-CM | POA: Diagnosis not present

## 2021-11-17 DIAGNOSIS — M9902 Segmental and somatic dysfunction of thoracic region: Secondary | ICD-10-CM | POA: Diagnosis not present

## 2021-11-17 DIAGNOSIS — M531 Cervicobrachial syndrome: Secondary | ICD-10-CM | POA: Diagnosis not present

## 2021-11-19 DIAGNOSIS — M9903 Segmental and somatic dysfunction of lumbar region: Secondary | ICD-10-CM | POA: Diagnosis not present

## 2021-11-19 DIAGNOSIS — M531 Cervicobrachial syndrome: Secondary | ICD-10-CM | POA: Diagnosis not present

## 2021-11-19 DIAGNOSIS — M9901 Segmental and somatic dysfunction of cervical region: Secondary | ICD-10-CM | POA: Diagnosis not present

## 2021-11-19 DIAGNOSIS — M9902 Segmental and somatic dysfunction of thoracic region: Secondary | ICD-10-CM | POA: Diagnosis not present

## 2021-11-21 DIAGNOSIS — M7632 Iliotibial band syndrome, left leg: Secondary | ICD-10-CM | POA: Diagnosis not present

## 2021-11-21 DIAGNOSIS — M9903 Segmental and somatic dysfunction of lumbar region: Secondary | ICD-10-CM | POA: Diagnosis not present

## 2021-11-21 DIAGNOSIS — M531 Cervicobrachial syndrome: Secondary | ICD-10-CM | POA: Diagnosis not present

## 2021-11-21 DIAGNOSIS — M9901 Segmental and somatic dysfunction of cervical region: Secondary | ICD-10-CM | POA: Diagnosis not present

## 2021-11-21 DIAGNOSIS — M9902 Segmental and somatic dysfunction of thoracic region: Secondary | ICD-10-CM | POA: Diagnosis not present

## 2021-11-25 ENCOUNTER — Ambulatory Visit: Payer: BC Managed Care – PPO | Admitting: Neurology

## 2021-11-25 DIAGNOSIS — K219 Gastro-esophageal reflux disease without esophagitis: Secondary | ICD-10-CM

## 2021-11-25 DIAGNOSIS — R0683 Snoring: Secondary | ICD-10-CM

## 2021-11-25 DIAGNOSIS — R635 Abnormal weight gain: Secondary | ICD-10-CM

## 2021-11-25 DIAGNOSIS — G4733 Obstructive sleep apnea (adult) (pediatric): Secondary | ICD-10-CM

## 2021-11-25 DIAGNOSIS — R519 Headache, unspecified: Secondary | ICD-10-CM

## 2021-11-25 DIAGNOSIS — E669 Obesity, unspecified: Secondary | ICD-10-CM

## 2021-11-25 DIAGNOSIS — G4719 Other hypersomnia: Secondary | ICD-10-CM

## 2021-11-25 DIAGNOSIS — Z9189 Other specified personal risk factors, not elsewhere classified: Secondary | ICD-10-CM

## 2021-11-25 DIAGNOSIS — R0681 Apnea, not elsewhere classified: Secondary | ICD-10-CM

## 2021-11-27 NOTE — Progress Notes (Signed)
See procedure note.

## 2021-11-28 NOTE — Addendum Note (Signed)
Addended by: Star Age on: 11/28/2021 11:28 AM   Modules accepted: Orders

## 2021-11-28 NOTE — Procedures (Signed)
   [facility]  HOME SLEEP TEST (Watch PAT) REPORT  STUDY DATE: 11/25/2021  DOB: 1985/12/08  MRN: [id number]  ORDERING CLINICIAN: Star Age, MD, PhD   REFERRING CLINICIAN: McGowen, Adrian Blackwater, MD   CLINICAL INFORMATION/HISTORY: 36 year old right-handed gentleman with an underlying medical history of allergic rhinitis, hypertension, reflux disease, irritable bowel syndrome, anxiety and obesity, who reports snoring and excessive daytime somnolence as well as witnessed apneas.   Epworth sleepiness score: 14/24.  BMI: 35.3 kg/m  FINDINGS:   Sleep Summary:   Total Recording Time (hours, min): 7 hours, 22 min  Total Sleep Time (hours, min):  6 hours, 32 min  Percent REM (%):    26%   Respiratory Indices:   Calculated pAHI (per hour):  7.7/hour         REM pAHI:    11.3/hour       NREM pAHI: 6.4/hour  Central pAHI: 0.8/hour  Oxygen Saturation Statistics:    Oxygen Saturation (%) Mean: 95%   Minimum oxygen saturation (%):                 89%   O2 Saturation Range (%): 89-99%    O2 Saturation (minutes) <=88%: 0 min  Pulse Rate Statistics:   Pulse Mean (bpm):    58/min    Pulse Range (40-95/min)   IMPRESSION: OSA (obstructive sleep apnea), mild  RECOMMENDATION:  This home sleep test demonstrates overall mild obstructive sleep apnea with a total AHI of 7.7/hour and O2 nadir of 89%.  Mild to moderate snoring was detected, at times intermittent.  Given the patient's medical history and sleep related complaints, treatment with positive airway pressure is recommended. This can be achieved in the form of autoPAP trial/titration at home. A full night CPAP titration study may aid with proper treatment settings and mask fitting if needed. Alternative treatments may include weight loss along (where appropriate) with avoidance of the supine sleep position, or an oral appliance in appropriate candidates.   Please note that untreated obstructive sleep apnea may  carry additional perioperative morbidity. Patients with significant obstructive sleep apnea should receive perioperative PAP therapy and the surgeons and particularly the anesthesiologist should be informed of the diagnosis and the severity of the sleep disordered breathing. The patient should be cautioned not to drive, work at heights, or operate dangerous or heavy equipment when tired or sleepy. Review and reiteration of good sleep hygiene measures should be pursued with any patient. Other causes of the patient's symptoms, including circadian rhythm disturbances, an underlying mood disorder, medication effect and/or an underlying medical problem cannot be ruled out based on this test. Clinical correlation is recommended.   The patient and his referring provider will be notified of the test results. The patient will be seen in follow up in sleep clinic at [facility].  I certify that I have reviewed the raw data recording prior to the issuance of this report in accordance with the standards of the American Academy of Sleep Medicine (AASM).  INTERPRETING PHYSICIAN:   Star Age, MD, PhD  Board Certified in Neurology and Sleep Medicine  [facility] [address], [place], [place] [address] [phone]

## 2021-12-01 ENCOUNTER — Telehealth: Payer: Self-pay

## 2021-12-01 NOTE — Telephone Encounter (Signed)
I called pt. No answer, left a message asking pt to call me back.   

## 2021-12-01 NOTE — Telephone Encounter (Signed)
I called pt and we discussed the results of the sleep study. He verbalized understanding and appreciation for the call. At this time he is going to speak with his PCP about the results and let us know if he is going to pursue autopap.  He sts discussions with PCP have been made about pursuing ENT referral for deviated septum. Pt will call back after talking with PCP and let us know how he will proceed.

## 2021-12-01 NOTE — Telephone Encounter (Signed)
Pt returned phone call, would like a call back.  

## 2021-12-01 NOTE — Telephone Encounter (Signed)
Noted, thank you

## 2021-12-01 NOTE — Telephone Encounter (Signed)
-----   Message from Star Age, MD sent at 11/28/2021 11:28 AM EDT ----- Patient referred by PCP, seen by me on 10/13/2021, HST 11/25/2021.    Please call and notify the patient that the recent home sleep test showed obstructive sleep apnea. OSA is overall mild, but worth treating to see if he feels better after treatment. To that end I recommend treatment for this in the form of autoPAP, which means, that we don't have to bring him in for a sleep study with CPAP, but will let him try an autoPAP machine at home, through a DME company (of his choice, or as per insurance requirement). The DME representative will educate him on how to use the machine, how to put the mask on, etc. I have placed an order in the chart. Please send referral, talk to patient, send report to referring MD. We will need a FU in sleep clinic for 10 weeks post-PAP set up, please arrange that with me or one of our NPs. Thanks,   Star Age, MD, PhD [facility])

## 2021-12-03 ENCOUNTER — Encounter: Payer: Self-pay | Admitting: Family Medicine

## 2021-12-08 DIAGNOSIS — M9901 Segmental and somatic dysfunction of cervical region: Secondary | ICD-10-CM | POA: Diagnosis not present

## 2021-12-08 DIAGNOSIS — M9903 Segmental and somatic dysfunction of lumbar region: Secondary | ICD-10-CM | POA: Diagnosis not present

## 2021-12-08 DIAGNOSIS — M9902 Segmental and somatic dysfunction of thoracic region: Secondary | ICD-10-CM | POA: Diagnosis not present

## 2021-12-08 DIAGNOSIS — M531 Cervicobrachial syndrome: Secondary | ICD-10-CM | POA: Diagnosis not present

## 2021-12-11 DIAGNOSIS — M9901 Segmental and somatic dysfunction of cervical region: Secondary | ICD-10-CM | POA: Diagnosis not present

## 2021-12-11 DIAGNOSIS — M531 Cervicobrachial syndrome: Secondary | ICD-10-CM | POA: Diagnosis not present

## 2021-12-11 DIAGNOSIS — M9902 Segmental and somatic dysfunction of thoracic region: Secondary | ICD-10-CM | POA: Diagnosis not present

## 2021-12-11 DIAGNOSIS — M9903 Segmental and somatic dysfunction of lumbar region: Secondary | ICD-10-CM | POA: Diagnosis not present

## 2021-12-15 DIAGNOSIS — M9903 Segmental and somatic dysfunction of lumbar region: Secondary | ICD-10-CM | POA: Diagnosis not present

## 2021-12-15 DIAGNOSIS — M9901 Segmental and somatic dysfunction of cervical region: Secondary | ICD-10-CM | POA: Diagnosis not present

## 2021-12-15 DIAGNOSIS — M531 Cervicobrachial syndrome: Secondary | ICD-10-CM | POA: Diagnosis not present

## 2021-12-15 DIAGNOSIS — M9902 Segmental and somatic dysfunction of thoracic region: Secondary | ICD-10-CM | POA: Diagnosis not present

## 2021-12-19 DIAGNOSIS — M531 Cervicobrachial syndrome: Secondary | ICD-10-CM | POA: Diagnosis not present

## 2021-12-19 DIAGNOSIS — M9903 Segmental and somatic dysfunction of lumbar region: Secondary | ICD-10-CM | POA: Diagnosis not present

## 2021-12-19 DIAGNOSIS — M9902 Segmental and somatic dysfunction of thoracic region: Secondary | ICD-10-CM | POA: Diagnosis not present

## 2021-12-19 DIAGNOSIS — M9901 Segmental and somatic dysfunction of cervical region: Secondary | ICD-10-CM | POA: Diagnosis not present

## 2021-12-20 DIAGNOSIS — R635 Abnormal weight gain: Secondary | ICD-10-CM | POA: Diagnosis not present

## 2021-12-20 DIAGNOSIS — R5383 Other fatigue: Secondary | ICD-10-CM | POA: Diagnosis not present

## 2021-12-20 DIAGNOSIS — E559 Vitamin D deficiency, unspecified: Secondary | ICD-10-CM | POA: Diagnosis not present

## 2021-12-20 DIAGNOSIS — Z79899 Other long term (current) drug therapy: Secondary | ICD-10-CM | POA: Diagnosis not present

## 2021-12-23 DIAGNOSIS — M9901 Segmental and somatic dysfunction of cervical region: Secondary | ICD-10-CM | POA: Diagnosis not present

## 2021-12-23 DIAGNOSIS — M9903 Segmental and somatic dysfunction of lumbar region: Secondary | ICD-10-CM | POA: Diagnosis not present

## 2021-12-23 DIAGNOSIS — M531 Cervicobrachial syndrome: Secondary | ICD-10-CM | POA: Diagnosis not present

## 2021-12-23 DIAGNOSIS — M9902 Segmental and somatic dysfunction of thoracic region: Secondary | ICD-10-CM | POA: Diagnosis not present

## 2022-01-02 ENCOUNTER — Other Ambulatory Visit: Payer: Self-pay | Admitting: Gastroenterology

## 2022-02-03 ENCOUNTER — Other Ambulatory Visit: Payer: Self-pay | Admitting: Family Medicine

## 2022-02-24 ENCOUNTER — Encounter: Payer: BC Managed Care – PPO | Admitting: Family Medicine

## 2022-02-27 ENCOUNTER — Encounter: Payer: Self-pay | Admitting: Family Medicine

## 2022-02-27 NOTE — Telephone Encounter (Signed)
Requesting: Lorazepam Contract: 08/21/21 UDS: 05/27/21 Last Visit: 08/21/21 Next Visit: 03/17/22 Last Refill: 08/21/21(90,5)  Please Advise. Med pending

## 2022-03-02 ENCOUNTER — Other Ambulatory Visit: Payer: Self-pay | Admitting: Family Medicine

## 2022-03-02 MED ORDER — LORAZEPAM 1 MG PO TABS: ORAL_TABLET | ORAL | 0 refills | Status: DC

## 2022-03-16 ENCOUNTER — Other Ambulatory Visit: Payer: Self-pay | Admitting: Family Medicine

## 2022-03-17 ENCOUNTER — Ambulatory Visit (INDEPENDENT_AMBULATORY_CARE_PROVIDER_SITE_OTHER): Payer: BC Managed Care – PPO | Admitting: Family Medicine

## 2022-03-17 ENCOUNTER — Encounter: Payer: Self-pay | Admitting: Family Medicine

## 2022-03-17 VITALS — BP 130/79 | HR 74 | Temp 98.3°F | Ht 69.75 in | Wt 208.6 lb

## 2022-03-17 DIAGNOSIS — F411 Generalized anxiety disorder: Secondary | ICD-10-CM

## 2022-03-17 DIAGNOSIS — I10 Essential (primary) hypertension: Secondary | ICD-10-CM | POA: Diagnosis not present

## 2022-03-17 DIAGNOSIS — Z0000 Encounter for general adult medical examination without abnormal findings: Secondary | ICD-10-CM | POA: Diagnosis not present

## 2022-03-17 DIAGNOSIS — L649 Androgenic alopecia, unspecified: Secondary | ICD-10-CM | POA: Diagnosis not present

## 2022-03-17 DIAGNOSIS — Z79899 Other long term (current) drug therapy: Secondary | ICD-10-CM

## 2022-03-17 LAB — COMPREHENSIVE METABOLIC PANEL
ALT: 26 U/L (ref 0–53)
AST: 20 U/L (ref 0–37)
Albumin: 4.9 g/dL (ref 3.5–5.2)
Alkaline Phosphatase: 46 U/L (ref 39–117)
BUN: 13 mg/dL (ref 6–23)
CO2: 32 mEq/L (ref 19–32)
Calcium: 9.9 mg/dL (ref 8.4–10.5)
Chloride: 100 mEq/L (ref 96–112)
Creatinine, Ser: 1.01 mg/dL (ref 0.40–1.50)
GFR: 95.39 mL/min (ref >60.00)
Glucose, Bld: 104 mg/dL — ABNORMAL HIGH (ref 70–99)
Potassium: 4.2 mEq/L (ref 3.5–5.1)
Sodium: 139 mEq/L (ref 135–145)
Total Bilirubin: 0.7 mg/dL (ref 0.2–1.2)
Total Protein: 7.2 g/dL (ref 6.0–8.3)

## 2022-03-17 LAB — CBC
HCT: 47.2 % (ref 39.0–52.0)
Hemoglobin: 16.2 g/dL (ref 13.0–17.0)
MCHC: 34.3 g/dL (ref 30.0–36.0)
MCV: 90.8 fl (ref 78.0–100.0)
Platelets: 200 10*3/uL (ref 150.0–400.0)
RBC: 5.19 Mil/uL (ref 4.22–5.81)
RDW: 12.8 % (ref 11.5–15.5)
WBC: 4 10*3/uL (ref 4.0–10.5)

## 2022-03-17 LAB — LIPID PANEL
Cholesterol: 157 mg/dL (ref 0–200)
HDL: 51.2 mg/dL (ref >39.00)
LDL Cholesterol: 87 mg/dL (ref 0–99)
NonHDL: 105.55
Total CHOL/HDL Ratio: 3
Triglycerides: 93 mg/dL (ref 0.0–149.0)
VLDL: 18.6 mg/dL (ref 0.0–40.0)

## 2022-03-17 LAB — TSH: TSH: 1.29 u[IU]/mL (ref 0.35–5.50)

## 2022-03-17 MED ORDER — LORAZEPAM 1 MG PO TABS: ORAL_TABLET | ORAL | 5 refills | Status: DC

## 2022-03-17 MED ORDER — FINASTERIDE 1 MG PO TABS: 1.0000 mg | ORAL_TABLET | Freq: Every day | ORAL | 1 refills | Status: DC

## 2022-03-17 NOTE — Patient Instructions (Signed)
Health Maintenance, Male Adopting a healthy lifestyle and getting preventive care are important in promoting health and wellness. Ask your health care provider about: The right schedule for you to have regular tests and exams. Things you can do on your own to prevent diseases and keep yourself healthy. What should I know about diet, weight, and exercise? Eat a healthy diet  Eat a diet that includes plenty of vegetables, fruits, low-fat dairy products, and lean protein. Do not eat a lot of foods that are high in solid fats, added sugars, or sodium. Maintain a healthy weight Body mass index (BMI) is a measurement that can be used to identify possible weight problems. It estimates body fat based on height and weight. Your health care provider can help determine your BMI and help you achieve or maintain a healthy weight. Get regular exercise Get regular exercise. This is one of the most important things you can do for your health. Most adults should: Exercise for at least 150 minutes each week. The exercise should increase your heart rate and make you sweat (moderate-intensity exercise). Do strengthening exercises at least twice a week. This is in addition to the moderate-intensity exercise. Spend less time sitting. Even light physical activity can be beneficial. Watch cholesterol and blood lipids Have your blood tested for lipids and cholesterol at 37 years of age, then have this test every 5 years. You may need to have your cholesterol levels checked more often if: Your lipid or cholesterol levels are high. You are older than 37 years of age. You are at high risk for heart disease. What should I know about cancer screening? Many types of cancers can be detected early and may often be prevented. Depending on your health history and family history, you may need to have cancer screening at various ages. This may include screening for: Colorectal cancer. Prostate cancer. Skin cancer. Lung  cancer. What should I know about heart disease, diabetes, and high blood pressure? Blood pressure and heart disease High blood pressure causes heart disease and increases the risk of stroke. This is more likely to develop in people who have high blood pressure readings or are overweight. Talk with your health care provider about your target blood pressure readings. Have your blood pressure checked: Every 3-5 years if you are 18-39 years of age. Every year if you are 40 years old or older. If you are between the ages of 65 and 75 and are a current or former smoker, ask your health care provider if you should have a one-time screening for abdominal aortic aneurysm (AAA). Diabetes Have regular diabetes screenings. This checks your fasting blood sugar level. Have the screening done: Once every three years after age 45 if you are at a normal weight and have a low risk for diabetes. More often and at a younger age if you are overweight or have a high risk for diabetes. What should I know about preventing infection? Hepatitis B If you have a higher risk for hepatitis B, you should be screened for this virus. Talk with your health care provider to find out if you are at risk for hepatitis B infection. Hepatitis C Blood testing is recommended for: Everyone born from 1945 through 1965. Anyone with known risk factors for hepatitis C. Sexually transmitted infections (STIs) You should be screened each year for STIs, including gonorrhea and chlamydia, if: You are sexually active and are younger than 37 years of age. You are older than 37 years of age and your   health care provider tells you that you are at risk for this type of infection. Your sexual activity has changed since you were last screened, and you are at increased risk for chlamydia or gonorrhea. Ask your health care provider if you are at risk. Ask your health care provider about whether you are at high risk for HIV. Your health care provider  may recommend a prescription medicine to help prevent HIV infection. If you choose to take medicine to prevent HIV, you should first get tested for HIV. You should then be tested every 3 months for as long as you are taking the medicine. Follow these instructions at home: Alcohol use Do not drink alcohol if your health care provider tells you not to drink. If you drink alcohol: Limit how much you have to 0-2 drinks a day. Know how much alcohol is in your drink. In the [place] one drink equals one 12 oz bottle of beer (355 mL), one 5 oz glass of wine (148 mL), or one 1 oz glass of hard liquor (44 mL). Lifestyle Do not use any products that contain nicotine or tobacco. These products include cigarettes, chewing tobacco, and vaping devices, such as e-cigarettes. If you need help quitting, ask your health care provider. Do not use street drugs. Do not share needles. Ask your health care provider for help if you need support or information about quitting drugs. General instructions Schedule regular health, dental, and eye exams. Stay current with your vaccines. Tell your health care provider if: You often feel depressed. You have ever been abused or do not feel safe at home. Summary Adopting a healthy lifestyle and getting preventive care are important in promoting health and wellness. Follow your health care provider's instructions about healthy diet, exercising, and getting tested or screened for diseases. Follow your health care provider's instructions on monitoring your cholesterol and blood pressure. This information is not intended to replace advice given to you by your health care provider. Make sure you discuss any questions you have with your health care provider. Document Revised: 10/07/2020 Document Reviewed: 10/07/2020 Elsevier Patient Education  2023 Elsevier Inc.  

## 2022-03-17 NOTE — Progress Notes (Signed)
Office Note 03/17/2022  CC:  Chief Complaint  Patient presents with   Annual Exam    Pt is not fasting    Patient is a 36 y.o. male who is here for annual health maintenance exam and follow-up hypertension and GAD. A/P as of last visit: "#1 obstructive sleep apnea suspected. We will refer to sleep specialist today. He does have some chronic nasal congestion that may be playing a role.  Holding off on ENT referral at this time.  2.  Chronic anxiety. He is doing well on lorazepam 1 mg 3 times daily and he is using this appropriately. Urine drug screen with appropriate results December 2022. Controlled substance contract updated today.   #3 hypertension.  Continue HCTZ 12.5 mg a day.   #4 Allergic rhinitis with suspected mild persistent asthma. Start flovent to 20 mcg, 2 puffs twice daily. Continue daily Flonase and Zyrtec."  INTERIM HX: Alec Downs feels very well. 30 lb purposeful wt loss in last 62mo>saxenda and phentermine the last 2 mo via wt loss specialist. Great exercise and dietary changes.  Dr. [place]bard [place] J[place]odd Downs  He has not taken his HCTZ and 3 to 4 weeks.  Occasional home measurement and measurements at his weight loss clinic have been normal still.  PMP AWARE reviewed today: most recent rx for lorazepam was filled 03/02/22, #45, rx by me. No red flags.   Past Medical History:  Diagnosis Date   Allergic rhinitis    Dysplastic nevi    back,thigh,chest (07/2019)   Epididymitis 09/2019   ordered scrotal u/s to further eval ? "nodule" pt felt at the time but pt did not return radiology's calls to schedule.   Essential hypertension    Family history of colon cancer    Uncle with col cancer, and father with adenomatous polyps in his 428s   Frequent headaches    secondary to chronic anxiety---CT scan brain normal in the past per pt report.   GAD (generalized anxiety disorder)    GERD (gastroesophageal reflux disease)    Irritable bowel syndrome     Obesity, Class I, BMI 30-34.9    OSA (obstructive sleep apnea) 10/2021   mild OSA    Past Surgical History:  Procedure Laterality Date   CLAVICLE SURGERY Left    bone spurs removed   ESOPHAGOGASTRODUODENOSCOPY  05/13/2021   antral gastritis, o/w normal.   HAIR TRANSPLANT  09/2015   [facility]   labrum repair Left     Family History  Problem Relation Age of Onset   Hypertension Mother    Arthritis Father    Irritable bowel syndrome Father    Colon polyps Father    Other Father        esophageal polyps ?   Other Sister        GERD   Diabetes Maternal Aunt    Pancreatic cancer Maternal Uncle    Diabetes Maternal Uncle    Stroke Maternal Grandmother    Diabetes Maternal Grandmother    Stroke Maternal Grandfather    Diabetes Maternal Grandfather    Skin cancer Maternal Grandfather    Stroke Paternal Grandmother    Aneurysm Paternal Grandmother    Stroke Paternal Grandfather    Colon cancer Paternal Grandfather    Stomach cancer Neg Hx    Esophageal cancer Neg Hx    Sleep apnea Neg Hx     Social History   Socioeconomic History   Marital status: Married    Spouse name: Not on file  Number of children: Not on file   Years of education: Not on file   Highest education level: Not on file  Occupational History   Not on file  Tobacco Use   Smoking status: Never   Smokeless tobacco: Never  Vaping Use   Vaping Use: Not on file  Substance and Sexual Activity   Alcohol use: Yes    Alcohol/week: 2.0 standard drinks of alcohol    Types: 2 Glasses of wine per week    Comment: OCC   Drug use: No   Sexual activity: Yes    Birth control/protection: None    Comment: married  Other Topics Concern   Not on file  Social History Narrative   Married, 2 daughters (as of 03/2018).   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   No T/A/Ds.   Social Determinants of Health   Financial Resource Strain: Not on file  Food Insecurity: Not on file   Transportation Needs: Not on file  Physical Activity: Not on file  Stress: Not on file  Social Connections: Not on file  Intimate Partner Violence: Not on file    Outpatient Medications Prior to Visit  Medication Sig Dispense Refill   cetirizine (ZYRTEC) 10 MG tablet Take 10 mg by mouth daily.     famotidine (PEPCID) 20 MG tablet TAKE 1 TABLET BY MOUTH TWICE A DAY 60 tablet 11   finasteride (PROPECIA) 1 MG tablet TAKE 1 TABLET BY MOUTH EVERY DAY 30 tablet 0   LORazepam (ATIVAN) 1 MG tablet 1 tab po q8h prn anxiety 45 tablet 0   pantoprazole (PROTONIX) 40 MG tablet Take 1 tablet (40 mg total) by mouth 2 (two) times daily. 180 tablet 3   phentermine (ADIPEX-P) 37.5 MG tablet Take 37.5 mg by mouth daily.     SAXENDA 18 MG/3ML SOPN Inject into the skin.     scopolamine (TRANSDERM-SCOP) 1 MG/3DAYS APPLY 1 TIME BY TRANSDERMAL ROUTE EVERY 3 DAYS 4 patch 0   Vitamin D, Ergocalciferol, (DRISDOL) 1.25 MG (50000 UNIT) CAPS capsule Take 50,000 Units by mouth once a week.     fluticasone (FLOVENT HFA) 220 MCG/ACT inhaler Inhale 2 puffs into the lungs 2 (two) times daily. (Patient taking differently: Inhale 2 puffs into the lungs as needed.) 1 each 1   hydrochlorothiazide (MICROZIDE) 12.5 MG capsule Take 1 capsule (12.5 mg total) by mouth daily. 90 capsule 1   sucralfate (CARAFATE) 1 g tablet Take 1 tablet (1 g total) by mouth 4 (four) times daily -  with meals and at bedtime. (Patient not taking: Reported on 03/17/2022) 120 tablet 0   No facility-administered medications prior to visit.    Allergies  Allergen Reactions   Duloxetine Other (See Comments)    Shaky, paranoid     Lisinopril     ROS Review of Systems  Constitutional:  Negative for appetite change, chills, fatigue and fever.  HENT:  Negative for congestion, dental problem, ear pain and sore throat.   Eyes:  Negative for discharge, redness and visual disturbance.  Respiratory:  Negative for cough, chest tightness, shortness of  breath and wheezing.   Cardiovascular:  Negative for chest pain, palpitations and leg swelling.  Gastrointestinal:  Negative for abdominal pain, blood in stool, diarrhea, nausea and vomiting.  Genitourinary:  Negative for difficulty urinating, dysuria, flank pain, frequency, hematuria and urgency.  Musculoskeletal:  Negative for arthralgias, back pain, joint swelling, myalgias and neck stiffness.  Skin:  Negative for pallor and rash.  Neurological:  Negative for dizziness, speech difficulty, weakness and headaches.  Hematological:  Negative for adenopathy. Does not bruise/bleed easily.  Psychiatric/Behavioral:  Negative for confusion and sleep disturbance. The patient is not nervous/anxious.    PE;    03/17/2022   11:01 AM 10/13/2021    9:08 AM 08/21/2021    2:58 PM  Vitals with BMI  Height 5' 9.75" '5\' 9"'$  '5\' 9"'$   Weight 208 lbs 10 oz 238 lbs 235 lbs 3 oz  BMI 30.13 26.71 24.58  Systolic 099 833 825  Diastolic 79 82 76  Pulse 74 44 74   Gen: Alert, well appearing.  Patient is oriented to person, place, time, and situation. AFFECT: pleasant, lucid thought and speech. ENT: Ears: EACs clear, normal epithelium.  TMs with good light reflex and landmarks bilaterally.  Eyes: no injection, icteris, swelling, or exudate.  EOMI, PERRLA. Nose: no drainage or turbinate edema/swelling.  No injection or focal lesion.  Mouth: lips without lesion/swelling.  Oral mucosa pink and moist.  Dentition intact and without obvious caries or gingival swelling.  Oropharynx without erythema, exudate, or swelling.  Neck: supple/nontender.  No LAD, mass, or TM.  Carotid pulses 2+ bilaterally, without bruits. CV: RRR, no m/r/g.   LUNGS: CTA bilat, nonlabored resps, good aeration in all lung fields. ABD: soft, NT, ND, BS normal.  No hepatospenomegaly or mass.  No bruits. EXT: no clubbing, cyanosis, or edema.  Musculoskeletal: no joint swelling, erythema, warmth, or tenderness.  ROM of all joints intact. Skin - no  sores or suspicious lesions or rashes or color changes  Pertinent labs:  Lab Results  Component Value Date   TSH 2.20 03/15/2018   Lab Results  Component Value Date   WBC 4.0 05/27/2021   HGB 15.8 05/27/2021   HCT 45.7 05/27/2021   MCV 87.9 05/27/2021   PLT 230.0 05/27/2021   Lab Results  Component Value Date   CREATININE 1.06 05/27/2021   BUN 20 05/27/2021   NA 140 05/27/2021   K 4.2 05/27/2021   CL 102 05/27/2021   CO2 28 05/27/2021   Lab Results  Component Value Date   ALT 37 03/15/2018   AST 21 03/15/2018   ALKPHOS 45 03/15/2018   BILITOT 0.6 03/15/2018   Lab Results  Component Value Date   CHOL 149 03/15/2018   Lab Results  Component Value Date   HDL 42.00 03/15/2018   Lab Results  Component Value Date   LDLCALC 78 03/15/2018   Lab Results  Component Value Date   TRIG 148.0 03/15/2018   Lab Results  Component Value Date   CHOLHDL 4 03/15/2018   ASSESSMENT AND PLAN:   #1 health maintenance exam: Reviewed age and gender appropriate health maintenance issues (prudent diet, regular exercise, health risks of tobacco and excessive alcohol, use of seatbelts, fire alarms in home, use of sunscreen).  Also reviewed age and gender appropriate health screening as well as vaccine recommendations. Vaccines: UTD Labs: Health panel (not fasting)  #2 hypertension, doing well off of HCTZ now.  He will continue to monitor and he has HCTZ at home to restart if blood pressure consistently rises over 130/80.  He will call us if he ends up doing this.  3.  GAD.  Doing well on lorazepam 1 mg twice daily to 3 times daily long-term. Controlled substance contract up-to-date.  Will repeat urine drug screen at next follow-up.  #4 male pattern alopecia.  Continue Propecia 1 mg a day.  #5 obesity.  BMI  has decreased from 35 to 30 over the last 7 months.  He has been doing excellent diet and exercise and using Saxenda and phentermine under the guidance of a weight loss  specialist.  He anticipates going off both of these medicines soon.  An After Visit Summary was printed and given to the patient.  FOLLOW UP:  Return in about 3 months (around 06/17/2022) for routine chronic illness f/u.  Signed:  Crissie Sickles, MD           03/17/2022

## 2022-03-19 ENCOUNTER — Other Ambulatory Visit (INDEPENDENT_AMBULATORY_CARE_PROVIDER_SITE_OTHER): Payer: BC Managed Care – PPO

## 2022-03-19 DIAGNOSIS — R7301 Impaired fasting glucose: Secondary | ICD-10-CM | POA: Diagnosis not present

## 2022-03-19 LAB — HEMOGLOBIN A1C: Hgb A1c MFr Bld: 5.3 % (ref 4.6–6.5)

## 2022-03-31 ENCOUNTER — Other Ambulatory Visit: Payer: Self-pay | Admitting: Family Medicine

## 2022-04-01 MED ORDER — SCOPOLAMINE 1 MG/3DAYS TD PT72: MEDICATED_PATCH | TRANSDERMAL | 0 refills | Status: DC

## 2022-08-03 ENCOUNTER — Other Ambulatory Visit: Payer: Self-pay | Admitting: Gastroenterology

## 2022-08-17 ENCOUNTER — Other Ambulatory Visit: Payer: Self-pay | Admitting: Family Medicine

## 2022-08-18 MED ORDER — SCOPOLAMINE 1 MG/3DAYS TD PT72: MEDICATED_PATCH | TRANSDERMAL | 1 refills | Status: DC

## 2022-09-16 ENCOUNTER — Other Ambulatory Visit: Payer: Self-pay

## 2022-09-16 MED ORDER — LORAZEPAM 1 MG PO TABS: ORAL_TABLET | ORAL | 5 refills | Status: DC

## 2022-09-16 NOTE — Telephone Encounter (Signed)
Requesting: lorazepam Contract:08/21/21 UDS:05/27/21 Last Visit: 03/17/22 Next Visit: recommended 3 months Last Refill:03/17/22 (90,5)  Please Advise

## 2022-09-18 ENCOUNTER — Other Ambulatory Visit: Payer: Self-pay | Admitting: Family Medicine

## 2022-10-29 ENCOUNTER — Other Ambulatory Visit: Payer: Self-pay | Admitting: Gastroenterology

## 2024-01-26 ENCOUNTER — Encounter: Payer: Self-pay | Admitting: Family Medicine

## 2024-01-26 ENCOUNTER — Ambulatory Visit: Admitting: Family Medicine

## 2024-01-26 VITALS — BP 131/75 | HR 62 | Temp 98.2°F | Ht 69.0 in | Wt 156.4 lb

## 2024-01-26 DIAGNOSIS — F410 Panic disorder [episodic paroxysmal anxiety] without agoraphobia: Secondary | ICD-10-CM | POA: Diagnosis not present

## 2024-01-26 DIAGNOSIS — Z79899 Other long term (current) drug therapy: Secondary | ICD-10-CM

## 2024-01-26 DIAGNOSIS — F3160 Bipolar disorder, current episode mixed, unspecified: Secondary | ICD-10-CM | POA: Diagnosis not present

## 2024-01-26 LAB — COMPREHENSIVE METABOLIC PANEL WITH GFR
ALT: 19 U/L (ref 0–53)
AST: 15 U/L (ref 0–37)
Albumin: 4.9 g/dL (ref 3.5–5.2)
Alkaline Phosphatase: 44 U/L (ref 39–117)
BUN: 19 mg/dL (ref 6–23)
CO2: 33 meq/L — ABNORMAL HIGH (ref 19–32)
Calcium: 9.7 mg/dL (ref 8.4–10.5)
Chloride: 99 meq/L (ref 96–112)
Creatinine, Ser: 1.01 mg/dL (ref 0.40–1.50)
GFR: 94.15 mL/min (ref >60.00)
Glucose, Bld: 90 mg/dL (ref 70–99)
Potassium: 4.4 meq/L (ref 3.5–5.1)
Sodium: 141 meq/L (ref 135–145)
Total Bilirubin: 0.9 mg/dL (ref 0.2–1.2)
Total Protein: 7.3 g/dL (ref 6.0–8.3)

## 2024-01-26 LAB — CBC WITH DIFFERENTIAL/PLATELET
Basophils Absolute: 0 K/uL (ref 0.0–0.1)
Basophils Relative: 0.4 % (ref 0.0–3.0)
Eosinophils Absolute: 0.1 K/uL (ref 0.0–0.7)
Eosinophils Relative: 1 % (ref 0.0–5.0)
HCT: 44.9 % (ref 39.0–52.0)
Hemoglobin: 15.4 g/dL (ref 13.0–17.0)
Lymphocytes Relative: 21.9 % (ref 12.0–46.0)
Lymphs Abs: 1.1 K/uL (ref 0.7–4.0)
MCHC: 34.3 g/dL (ref 30.0–36.0)
MCV: 87.8 fl (ref 78.0–100.0)
Monocytes Absolute: 0.5 K/uL (ref 0.1–1.0)
Monocytes Relative: 9.1 % (ref 3.0–12.0)
Neutro Abs: 3.4 K/uL (ref 1.4–7.7)
Neutrophils Relative %: 67.6 % (ref 43.0–77.0)
Platelets: 240 K/uL (ref 150.0–400.0)
RBC: 5.11 Mil/uL (ref 4.22–5.81)
RDW: 12.5 % (ref 11.5–15.5)
WBC: 5.1 K/uL (ref 4.0–10.5)

## 2024-01-26 LAB — TSH: TSH: 0.66 u[IU]/mL (ref 0.35–5.50)

## 2024-01-26 MED ORDER — LORAZEPAM 1 MG PO TABS: ORAL_TABLET | ORAL | 0 refills | Status: DC

## 2024-01-26 MED ORDER — LITHIUM CARBONATE 300 MG PO TABS
ORAL_TABLET | ORAL | 0 refills | Status: DC
Start: 2024-01-26 — End: 2024-02-17

## 2024-01-26 NOTE — Progress Notes (Signed)
 OFFICE VISIT  01/26/2024  CC:  Chief Complaint  Patient presents with   Anxiety    Patient is a 38 y.o. male who presents for anxiety. I last saw him on 03/17/2022 for hypertension and anxiety.  He was doing well on lorazepam  1 mg 3 times a day at that time.  Blood pressure was also normal off of his antihypertensive.  He was having good success with weight loss with the use of Saxenda.  HPI: Since I saw him he established with a another doctor and had a lot of success with weight loss on phentermine. He stopped this medication a few months ago, though.  Due to some increase in anxiety his doctor started him on bupropion approximately 6 months ago.  Although Alec Downs has had some benefit from using lorazepam  on an as-needed basis in the past the physician apparently wanted him to try to wean off of this medication. Alec Downs feels like he is gradually gotten worse since this. He describes racing thoughts, being quite emotional, crying easily, very active with many projects but feeling exhausted, has sleep dysfunction, is spending money on [employer] products that he has boxes piled around his bed now.  His speech is pressured.  His family has noticed all of these things as well.  He feels easily overwhelmed and complains of feeling nervous and anxious all the time.  He also has had a few severe panic attacks characterized by excessive sweating, lightheadedness, palpitations, feeling like he was going to die.  He does not use any drugs or drink any alcohol. No suicidal or homicidal ideation. He says his symptoms have gotten even worse over the last 2 to 3 weeks. He ran out of lorazepam  a few weeks ago.  He has continued to take the bupropion.   PMP AWARE reviewed today: most recent rx for lorazepam  was filled 08/20/2023, # 30, rx by Bernardino Pinal in [facility].  Most recent Adipex 37.5 mg prescription was filled 12/28/2023, #30, prescription by Elenor Sieving in [place], [place] No red flags.   Past Medical  History:  Diagnosis Date   Allergic rhinitis    Dysplastic nevi    back,thigh,chest (07/2019)   Epididymitis 09/2019   ordered scrotal u/s to further eval ? nodule pt felt at the time but pt did not return radiology's calls to schedule.   Essential hypertension    Family history of colon cancer    Uncle with col cancer, and father with adenomatous polyps in his 49s.   Frequent headaches    secondary to chronic anxiety---CT scan brain normal in the past per pt report.   GAD (generalized anxiety disorder)    GERD (gastroesophageal reflux disease)    Irritable bowel syndrome    Obesity, Class I, BMI 30-34.9    OSA (obstructive sleep apnea) 10/2021   mild OSA    Past Surgical History:  Procedure Laterality Date   CLAVICLE SURGERY Left    bone spurs removed   ESOPHAGOGASTRODUODENOSCOPY  05/13/2021   antral gastritis, o/w normal.   HAIR TRANSPLANT  09/2015   [facility]   labrum repair Left     Outpatient Medications Prior to Visit  Medication Sig Dispense Refill   cetirizine (ZYRTEC) 10 MG tablet Take 10 mg by mouth daily.     famotidine  (PEPCID ) 20 MG tablet TAKE 1 TABLET BY MOUTH TWICE A DAY 60 tablet 11   finasteride  (PROPECIA ) 1 MG tablet Take 1 tablet (1 mg total) by mouth daily. 90 tablet 1  pantoprazole  (PROTONIX ) 40 MG tablet TAKE 1 TABLET BY MOUTH TWICE A DAY 180 tablet 0   buPROPion (WELLBUTRIN XL) 300 MG 24 hr tablet Take 300 mg by mouth daily.     scopolamine  (TRANSDERM-SCOP) 1 MG/3DAYS APPLY 1 TIME BY TRANSDERMAL ROUTE EVERY 3 DAYS (Patient not taking: Reported on 01/26/2024) 4 patch 1   LORazepam  (ATIVAN ) 1 MG tablet 1 tab po q8h prn anxiety (Patient not taking: Reported on 01/26/2024) 90 tablet 5   phentermine (ADIPEX-P) 37.5 MG tablet Take 37.5 mg by mouth daily. (Patient not taking: Reported on 01/26/2024)     SAXENDA 18 MG/3ML SOPN Inject into the skin. (Patient not taking: Reported on 01/26/2024)     Vitamin D, Ergocalciferol, (DRISDOL) 1.25 MG (50000 UNIT) CAPS  capsule Take 50,000 Units by mouth once a week. (Patient not taking: Reported on 01/26/2024)     No facility-administered medications prior to visit.    Allergies  Allergen Reactions   Duloxetine  Other (See Comments)    Shaky, paranoid     Lisinopril      Review of Systems  As per HPI  PE:    01/26/2024   10:47 AM 03/17/2022   11:01 AM 10/13/2021    9:08 AM  Vitals with BMI  Height 5' 9 5' 9.75 5' 9  Weight 156 lbs 6 oz 208 lbs 10 oz 238 lbs  BMI 23.09 30.13 35.13  Systolic 131 130 872  Diastolic 75 79 82  Pulse 62 74 44     Physical Exam  General: Anxious, fidgety, talking a bit pressured, pleasant and very interactive. Most of the time I have to interrupt him to ask questions but then he pauses appropriately and listens.  A couple of times he was holding back tears. ZWU:Zbzd: no injection, icteris, swelling, or exudate.  EOMI, PERRLA. Mouth: lips without lesion/swelling.  Oral mucosa pink and moist. Oropharynx without erythema, exudate, or swelling.  Neck: no thyromegaly. Cardiovascular: Regular rhythm and rate without murmur rub or gallop. Lungs are clear bilaterally, breathing nonlabored. Extremities: No edema. No tremor.  Patellar DTR 4+.  He has 1 faint beat of clonus in the ankles but just trace DTR at Achilles. Trace DTR in biceps and triceps bilateral.  LABS:  Last CBC Lab Results  Component Value Date   WBC 4.0 03/17/2022   HGB 16.2 03/17/2022   HCT 47.2 03/17/2022   MCV 90.8 03/17/2022   RDW 12.8 03/17/2022   PLT 200.0 03/17/2022   Last metabolic panel Lab Results  Component Value Date   GLUCOSE 104 (H) 03/17/2022   NA 139 03/17/2022   K 4.2 03/17/2022   CL 100 03/17/2022   CO2 32 03/17/2022   BUN 13 03/17/2022   CREATININE 1.01 03/17/2022   GFR 95.39 03/17/2022   CALCIUM 9.9 03/17/2022   PROT 7.2 03/17/2022   ALBUMIN 4.9 03/17/2022   BILITOT 0.7 03/17/2022   ALKPHOS 46 03/17/2022   AST 20 03/17/2022   ALT 26 03/17/2022   Last  lipids Lab Results  Component Value Date   CHOL 157 03/17/2022   HDL 51.20 03/17/2022   LDLCALC 87 03/17/2022   TRIG 93.0 03/17/2022   CHOLHDL 3 03/17/2022   Last hemoglobin A1c Lab Results  Component Value Date   HGBA1C 5.3 03/19/2022   Last thyroid  functions Lab Results  Component Value Date   TSH 1.29 03/17/2022   IMPRESSION AND PLAN:  Bipolar 2 disorder, mixed episode. Panic attacks.  Discussed diagnoses, answered questions to the best of my  ability, discussed potential medications and their potential therapeutic effects and potential side effects. Decided to start lithium  300 mg tabs. Instructions: Take 1 lithium  tablet this evening. Tomorrow take 1 lithium  tablet in the morning and 1 in the evening. On Friday take 1 tab in the morning, 1 in the early afternoon, and 1 in the evening. From that point on take 1 tab 3 times a day. When I see him back in 1 week we will check a trough lithium  level.  I did prescribe #30 lorazepam  1 mg tabs to take 1/2-1 tab every 8 hours as needed.  Check CBC, c-Met, and TSH today.  An After Visit Summary was printed and given to the patient.  FOLLOW UP: Return in about 1 week (around 02/02/2024), or f/u bipolar/anxiety.  Signed:  Gerlene Hockey, MD           01/26/2024

## 2024-01-26 NOTE — Patient Instructions (Signed)
 Take 1 lithium  tablet this evening. Tomorrow take 1 lithium  tablet in the morning and 1 in the evening. On Friday take 1 tab in the morning, 1 in the early afternoon, and 1 in the evening. From that point on take 1 tab 3 times a day.

## 2024-01-27 ENCOUNTER — Ambulatory Visit: Payer: Self-pay | Admitting: Family Medicine

## 2024-02-01 ENCOUNTER — Ambulatory Visit: Admitting: Family Medicine

## 2024-02-01 NOTE — Progress Notes (Deleted)
 OFFICE VISIT  02/01/2024  CC: No chief complaint on file.   Patient is a 38 y.o. male who presents for 6-day follow-up bipolar disorder and anxiety. A/P as of last visit: Discussed diagnoses, answered questions to the best of my ability, discussed potential medications and their potential therapeutic effects and potential side effects. Decided to start lithium  300 mg tabs. Instructions: Take 1 lithium  tablet this evening. Tomorrow take 1 lithium  tablet in the morning and 1 in the evening. On Friday take 1 tab in the morning, 1 in the early afternoon, and 1 in the evening. From that point on take 1 tab 3 times a day. When I see him back in 1 week we will check a trough lithium  level.  I did prescribe #30 lorazepam  1 mg tabs to take 1/2-1 tab every 8 hours as needed.  Check CBC, c-Met, and TSH today.  INTERIM HX: ***   PMP AWARE reviewed today: most recent rx for lorazepam  was filled 01/26/2024, # 30, rx by me. No red flags.   Past Medical History:  Diagnosis Date   Allergic rhinitis    Dysplastic nevi    back,thigh,chest (07/2019)   Epididymitis 09/2019   ordered scrotal u/s to further eval ? nodule pt felt at the time but pt did not return radiology's calls to schedule.   Essential hypertension    Family history of colon cancer    Uncle with col cancer, and father with adenomatous polyps in his 95s.   Frequent headaches    secondary to chronic anxiety---CT scan brain normal in the past per pt report.   GAD (generalized anxiety disorder)    GERD (gastroesophageal reflux disease)    Irritable bowel syndrome    Obesity, Class I, BMI 30-34.9    OSA (obstructive sleep apnea) 10/2021   mild OSA    Past Surgical History:  Procedure Laterality Date   CLAVICLE SURGERY Left    bone spurs removed   ESOPHAGOGASTRODUODENOSCOPY  05/13/2021   antral gastritis, o/w normal.   HAIR TRANSPLANT  09/2015   [facility]   labrum repair Left     Outpatient Medications Prior to Visit   Medication Sig Dispense Refill   cetirizine (ZYRTEC) 10 MG tablet Take 10 mg by mouth daily.     famotidine  (PEPCID ) 20 MG tablet TAKE 1 TABLET BY MOUTH TWICE A DAY 60 tablet 11   finasteride  (PROPECIA ) 1 MG tablet Take 1 tablet (1 mg total) by mouth daily. 90 tablet 1   lithium  300 MG tablet 1 tab po tid 45 tablet 0   LORazepam  (ATIVAN ) 1 MG tablet 1 tab po q8h prn anxiety 30 tablet 0   pantoprazole  (PROTONIX ) 40 MG tablet TAKE 1 TABLET BY MOUTH TWICE A DAY 180 tablet 0   scopolamine  (TRANSDERM-SCOP) 1 MG/3DAYS APPLY 1 TIME BY TRANSDERMAL ROUTE EVERY 3 DAYS (Patient not taking: Reported on 01/26/2024) 4 patch 1   No facility-administered medications prior to visit.    Allergies  Allergen Reactions   Duloxetine  Other (See Comments)    Shaky, paranoid     Lisinopril      Review of Systems As per HPI  PE:    01/26/2024   10:47 AM 03/17/2022   11:01 AM 10/13/2021    9:08 AM  Vitals with BMI  Height 5' 9 5' 9.75 5' 9  Weight 156 lbs 6 oz 208 lbs 10 oz 238 lbs  BMI 23.09 30.13 35.13  Systolic 131 130 872  Diastolic 75 79 82  Pulse  62 74 44     Physical Exam  ***  LABS:  Last CBC Lab Results  Component Value Date   WBC 5.1 01/26/2024   HGB 15.4 01/26/2024   HCT 44.9 01/26/2024   MCV 87.8 01/26/2024   RDW 12.5 01/26/2024   PLT 240.0 01/26/2024   Last metabolic panel Lab Results  Component Value Date   GLUCOSE 90 01/26/2024   NA 141 01/26/2024   K 4.4 01/26/2024   CL 99 01/26/2024   CO2 33 (H) 01/26/2024   BUN 19 01/26/2024   CREATININE 1.01 01/26/2024   GFR 94.15 01/26/2024   CALCIUM 9.7 01/26/2024   PROT 7.3 01/26/2024   ALBUMIN 4.9 01/26/2024   BILITOT 0.9 01/26/2024   ALKPHOS 44 01/26/2024   AST 15 01/26/2024   ALT 19 01/26/2024   Last lipids Lab Results  Component Value Date   CHOL 157 03/17/2022   HDL 51.20 03/17/2022   LDLCALC 87 03/17/2022   TRIG 93.0 03/17/2022   CHOLHDL 3 03/17/2022   Last hemoglobin A1c Lab Results  Component  Value Date   HGBA1C 5.3 03/19/2022   Last thyroid  functions Lab Results  Component Value Date   TSH 0.66 01/26/2024   IMPRESSION AND PLAN:  No problem-specific Assessment & Plan notes found for this encounter.   An After Visit Summary was printed and given to the patient.  FOLLOW UP: No follow-ups on file.  Signed:  Gerlene Hockey, MD           02/01/2024

## 2024-02-08 ENCOUNTER — Ambulatory Visit: Admitting: Family Medicine

## 2024-02-08 NOTE — Progress Notes (Deleted)
 OFFICE VISIT  02/08/2024  CC: No chief complaint on file.   Patient is a 38 y.o. male who presents for 2-week follow-up bipolar disorder. I diagnosed him with bipolar 2 disorder, mixed episode.  He was having panic attacks as well. I started him on lithium  300 mg daily, slowly ramp up to 1 tid.  I also prescribed lorazepam  1 mg, 1/2-1 tab every 8 hours as needed, #30. CBC, CMET, and TSH that day were normal.  INTERIM HX: ***   PMP AWARE reviewed today: most recent rx for lorazepam  1 mg was filled 01/26/2024, # 30, rx by me. Most recent Adipex 37.5 mg prescription was filled 9-25, #30, prescription by Delon Edouard [place] No red flags.  Past Medical History:  Diagnosis Date   Allergic rhinitis    Dysplastic nevi    back,thigh,chest (07/2019)   Epididymitis 09/2019   ordered scrotal u/s to further eval ? nodule pt felt at the time but pt did not return radiology's calls to schedule.   Essential hypertension    Family history of colon cancer    Uncle with col cancer, and father with adenomatous polyps in his 74s.   Frequent headaches    secondary to chronic anxiety---CT scan brain normal in the past per pt report.   GAD (generalized anxiety disorder)    GERD (gastroesophageal reflux disease)    Irritable bowel syndrome    Obesity, Class I, BMI 30-34.9    OSA (obstructive sleep apnea) 10/2021   mild OSA    Past Surgical History:  Procedure Laterality Date   CLAVICLE SURGERY Left    bone spurs removed   ESOPHAGOGASTRODUODENOSCOPY  05/13/2021   antral gastritis, o/w normal.   HAIR TRANSPLANT  09/2015   [facility]   labrum repair Left     Outpatient Medications Prior to Visit  Medication Sig Dispense Refill   cetirizine (ZYRTEC) 10 MG tablet Take 10 mg by mouth daily.     famotidine  (PEPCID ) 20 MG tablet TAKE 1 TABLET BY MOUTH TWICE A DAY 60 tablet 11   finasteride  (PROPECIA ) 1 MG tablet Take 1 tablet (1 mg total) by mouth daily. 90 tablet 1   lithium  300 MG tablet 1 tab  po tid 45 tablet 0   LORazepam  (ATIVAN ) 1 MG tablet 1 tab po q8h prn anxiety 30 tablet 0   pantoprazole  (PROTONIX ) 40 MG tablet TAKE 1 TABLET BY MOUTH TWICE A DAY 180 tablet 0   scopolamine  (TRANSDERM-SCOP) 1 MG/3DAYS APPLY 1 TIME BY TRANSDERMAL ROUTE EVERY 3 DAYS (Patient not taking: Reported on 01/26/2024) 4 patch 1   No facility-administered medications prior to visit.    Allergies  Allergen Reactions   Duloxetine  Other (See Comments)    Shaky, paranoid     Lisinopril      Review of Systems As per HPI  PE:    01/26/2024   10:47 AM 03/17/2022   11:01 AM 10/13/2021    9:08 AM  Vitals with BMI  Height 5' 9 5' 9.75 5' 9  Weight 156 lbs 6 oz 208 lbs 10 oz 238 lbs  BMI 23.09 30.13 35.13  Systolic 131 130 872  Diastolic 75 79 82  Pulse 62 74 44     Physical Exam  ***  LABS:  Last CBC Lab Results  Component Value Date   WBC 5.1 01/26/2024   HGB 15.4 01/26/2024   HCT 44.9 01/26/2024   MCV 87.8 01/26/2024   RDW 12.5 01/26/2024   PLT 240.0 01/26/2024  Last metabolic panel Lab Results  Component Value Date   GLUCOSE 90 01/26/2024   NA 141 01/26/2024   K 4.4 01/26/2024   CL 99 01/26/2024   CO2 33 (H) 01/26/2024   BUN 19 01/26/2024   CREATININE 1.01 01/26/2024   GFR 94.15 01/26/2024   CALCIUM 9.7 01/26/2024   PROT 7.3 01/26/2024   ALBUMIN 4.9 01/26/2024   BILITOT 0.9 01/26/2024   ALKPHOS 44 01/26/2024   AST 15 01/26/2024   ALT 19 01/26/2024   Last thyroid  functions Lab Results  Component Value Date   TSH 0.66 01/26/2024   IMPRESSION AND PLAN:  No problem-specific Assessment & Plan notes found for this encounter.   An After Visit Summary was printed and given to the patient.  FOLLOW UP: No follow-ups on file.  Signed:  Gerlene Hockey, MD           02/08/2024

## 2024-02-11 ENCOUNTER — Ambulatory Visit: Admitting: Family Medicine

## 2024-02-16 ENCOUNTER — Ambulatory Visit: Admitting: Family Medicine

## 2024-02-17 ENCOUNTER — Ambulatory Visit: Admitting: Family Medicine

## 2024-02-17 VITALS — BP 129/71 | HR 84 | Temp 98.0°F | Ht 69.0 in | Wt 164.2 lb

## 2024-02-17 DIAGNOSIS — J209 Acute bronchitis, unspecified: Secondary | ICD-10-CM | POA: Diagnosis not present

## 2024-02-17 DIAGNOSIS — F3162 Bipolar disorder, current episode mixed, moderate: Secondary | ICD-10-CM

## 2024-02-17 MED ORDER — PREDNISONE 20 MG PO TABS: ORAL_TABLET | ORAL | 0 refills | Status: DC

## 2024-02-17 MED ORDER — SCOPOLAMINE 1 MG/3DAYS TD PT72: MEDICATED_PATCH | TRANSDERMAL | 1 refills | Status: AC

## 2024-02-17 MED ORDER — ALBUTEROL SULFATE HFA 108 (90 BASE) MCG/ACT IN AERS: 2.0000 | INHALATION_SPRAY | Freq: Four times a day (QID) | RESPIRATORY_TRACT | 0 refills | Status: AC | PRN

## 2024-02-17 MED ORDER — LITHIUM CARBONATE 300 MG PO TABS: ORAL_TABLET | ORAL | 0 refills | Status: DC

## 2024-02-17 MED ORDER — LORAZEPAM 1 MG PO TABS: ORAL_TABLET | ORAL | 0 refills | Status: DC

## 2024-02-17 NOTE — Progress Notes (Signed)
 OFFICE VISIT  02/17/2024  CC:  Chief Complaint  Patient presents with   Medical Management of Chronic Issues    Pt would like to discuss Lithium ; wife noticed exaggerated reactions starting a few days ago.     Patient is a 38 y.o. male who presents for 3-week follow-up bipolar disorder. A/P as of last visit: Bipolar 2 disorder, mixed episode. Panic attacks. Discussed diagnoses, answered questions to the best of my ability, discussed potential medications and their potential therapeutic effects and potential side effects. Decided to start lithium  300 mg tabs. Instructions: Take 1 lithium  tablet this evening. Tomorrow take 1 lithium  tablet in the morning and 1 in the evening. On Friday take 1 tab in the morning, 1 in the early afternoon, and 1 in the evening. From that point on take 1 tab 3 times a day. When I see him back in 1 week we will check a trough lithium  level. I did prescribe #30 lorazepam  1 mg tabs to take 1/2-1 tab every 8 hours as needed. Check CBC, c-Met, and TSH today.  INTERIM HX: He noted some improvement improvement in mood and attention, overall sense of wellbeing improved.  After about a week he felt like these improvements wore off some.  His wife has noticed more anger/mood swings in the last week or so.  He has poor energy but difficulty sleeping.  Less crying spells.  He has a little bit of tremulousness in the evenings and has noticed increased sweating.  He says the side effects are tolerable. He has been taking lorazepam , mostly at bedtime.  Initially helped him sleep some but not anymore.  He says he has not taken the Adipex in a couple of weeks.  He has had URI and rattly cough for about 8 to 10 days.  He does not feel like is getting any better.  No fever or shortness of breath.  He went to an urgent care and was prescribed amoxicillin a couple days ago.  ROS as above, plus-->  no wheezing,  no dizziness, no HAs, no rashes, no melena/hematochezia.  No  polyuria or polydipsia.  No myalgias or arthralgias.  No focal weakness or paresthesias.  No acute vision or hearing abnormalities.  No dysuria or unusual/new urinary urgency or frequency.  No recent changes in lower legs. No n/v/d or abd pain.  No palpitations.      PMP AWARE reviewed today: most recent rx for lorazepam  was filled 01/26/24, # 30, rx by me. Most recent adipex was filled 02/01/24,#30,rx by Delon Edouard. No red flags.  Past Medical History:  Diagnosis Date   Allergic rhinitis    Dysplastic nevi    back,thigh,chest (07/2019)   Epididymitis 09/2019   ordered scrotal u/s to further eval ? nodule pt felt at the time but pt did not return radiology's calls to schedule.   Essential hypertension    Family history of colon cancer    Uncle with col cancer, and father with adenomatous polyps in his 68s.   Frequent headaches    secondary to chronic anxiety---CT scan brain normal in the past per pt report.   GAD (generalized anxiety disorder)    GERD (gastroesophageal reflux disease)    Irritable bowel syndrome    Obesity, Class I, BMI 30-34.9    OSA (obstructive sleep apnea) 10/2021   mild OSA    Past Surgical History:  Procedure Laterality Date   CLAVICLE SURGERY Left    bone spurs removed   ESOPHAGOGASTRODUODENOSCOPY  05/13/2021  antral gastritis, o/w normal.   HAIR TRANSPLANT  09/2015   [facility]   labrum repair Left     Outpatient Medications Prior to Visit  Medication Sig Dispense Refill   famotidine  (PEPCID ) 20 MG tablet TAKE 1 TABLET BY MOUTH TWICE A DAY 60 tablet 11   finasteride  (PROPECIA ) 1 MG tablet Take 1 tablet (1 mg total) by mouth daily. 90 tablet 1   pantoprazole  (PROTONIX ) 40 MG tablet TAKE 1 TABLET BY MOUTH TWICE A DAY 180 tablet 0   lithium  300 MG tablet 1 tab po tid 45 tablet 0   LORazepam  (ATIVAN ) 1 MG tablet 1 tab po q8h prn anxiety 30 tablet 0   cetirizine (ZYRTEC) 10 MG tablet Take 10 mg by mouth daily.     scopolamine  (TRANSDERM-SCOP) 1  MG/3DAYS APPLY 1 TIME BY TRANSDERMAL ROUTE EVERY 3 DAYS (Patient not taking: Reported on 02/17/2024) 4 patch 1   No facility-administered medications prior to visit.    Allergies  Allergen Reactions   Duloxetine  Other (See Comments)    Shaky, paranoid     Lisinopril      Review of Systems As per HPI  PE:    02/17/2024    8:46 AM 01/26/2024   10:47 AM 03/17/2022   11:01 AM  Vitals with BMI  Height 5' 9 5' 9 5' 9.75  Weight 164 lbs 3 oz 156 lbs 6 oz 208 lbs 10 oz  BMI 24.24 23.09 30.13  Systolic 129 131 869  Diastolic 71 75 79  Pulse 84 62 74     Physical Exam  VS: noted--normal. Gen: alert, NAD, NONTOXIC APPEARING. HEENT: eyes without injection, drainage, or swelling.  Ears: EACs clear, TMs with normal light reflex and landmarks.  Nose: Clear rhinorrhea, with some dried, crusty exudate adherent to mildly injected mucosa.  No purulent d/c.  No paranasal sinus TTP.  No facial swelling.  Throat and mouth without focal lesion.  No pharyngial swelling, erythema, or exudate.   Neck: supple, no LAD.   LUNGS: CTA bilat, nonlabored resps.   CV: RRR, no m/r/g. EXT: no c/c/e SKIN: no rash   LABS:  Last CBC Lab Results  Component Value Date   WBC 5.1 01/26/2024   HGB 15.4 01/26/2024   HCT 44.9 01/26/2024   MCV 87.8 01/26/2024   RDW 12.5 01/26/2024   PLT 240.0 01/26/2024   Last metabolic panel Lab Results  Component Value Date   GLUCOSE 90 01/26/2024   NA 141 01/26/2024   K 4.4 01/26/2024   CL 99 01/26/2024   CO2 33 (H) 01/26/2024   BUN 19 01/26/2024   CREATININE 1.01 01/26/2024   GFR 94.15 01/26/2024   CALCIUM 9.7 01/26/2024   PROT 7.3 01/26/2024   ALBUMIN 4.9 01/26/2024   BILITOT 0.9 01/26/2024   ALKPHOS 44 01/26/2024   AST 15 01/26/2024   ALT 19 01/26/2024   Last thyroid  functions Lab Results  Component Value Date   TSH 0.66 01/26/2024   IMPRESSION AND PLAN:  #1 bipolar disorder, mixed. Slight improvement with lithium  but plateaued. I do not think  his irritability is due to the lithium . Will add a 300 mg midday dose.  He will continue with 300 mg dose in the morning and at bedtime. Continue lorazepam  1 mg 3 times daily as needed.  He will continue to avoid Adipex right now. Check lithium  level today.  2.  Prolonged URI with acute bronchitis. Prednisone  40 mg a day x 5 days then 20 mg daily x 5  days. Albuterol  inhaler 2 puffs every 6 hours as needed. He will go ahead and take the amoxicillin that he was prescribed at urgent care recently.  An After Visit Summary was printed and given to the patient.  FOLLOW UP: Return in about 2 weeks (around 03/02/2024) for recheck of current acute problem.  Signed:  Gerlene Hockey, MD           02/17/2024

## 2024-02-18 LAB — LITHIUM LEVEL: Lithium Lvl: <0.3 mmol/L — ABNORMAL LOW (ref 0.6–1.2)

## 2024-02-20 ENCOUNTER — Ambulatory Visit: Payer: Self-pay | Admitting: Family Medicine

## 2024-03-02 ENCOUNTER — Encounter: Payer: Self-pay | Admitting: Family Medicine

## 2024-03-02 ENCOUNTER — Ambulatory Visit: Admitting: Family Medicine

## 2024-03-02 VITALS — BP 127/63 | HR 73 | Temp 98.6°F | Ht 69.0 in | Wt 160.0 lb

## 2024-03-02 DIAGNOSIS — F3177 Bipolar disorder, in partial remission, most recent episode mixed: Secondary | ICD-10-CM

## 2024-03-02 DIAGNOSIS — F411 Generalized anxiety disorder: Secondary | ICD-10-CM

## 2024-03-02 DIAGNOSIS — F99 Mental disorder, not otherwise specified: Secondary | ICD-10-CM

## 2024-03-02 DIAGNOSIS — F5105 Insomnia due to other mental disorder: Secondary | ICD-10-CM | POA: Diagnosis not present

## 2024-03-02 MED ORDER — LITHIUM CARBONATE 300 MG PO TABS: ORAL_TABLET | ORAL | 1 refills | Status: DC

## 2024-03-02 MED ORDER — FINASTERIDE 1 MG PO TABS: 1.0000 mg | ORAL_TABLET | Freq: Every day | ORAL | 3 refills | Status: AC

## 2024-03-02 MED ORDER — LORAZEPAM 1 MG PO TABS: ORAL_TABLET | ORAL | 0 refills | Status: DC

## 2024-03-02 NOTE — Progress Notes (Signed)
 OFFICE VISIT  03/02/2024  CC:  Chief Complaint  Patient presents with   Medical Management of Chronic Issues    Patient is a 38 y.o. male who presents for 2-week follow-up bipolar disorder and bronchitis. A/P as of last visit: #1 bipolar disorder, mixed. Slight improvement with lithium  but plateaued. I do not think his irritability is due to the lithium . Will add a 300 mg midday dose.  He will continue with 300 mg dose in the morning and at bedtime. Continue lorazepam  1 mg 3 times daily as needed.  He will continue to avoid Adipex right now. Check lithium  level today.   2.  Prolonged URI with acute bronchitis. Prednisone  40 mg a day x 5 days then 20 mg daily x 5 days. Albuterol  inhaler 2 puffs every 6 hours as needed. He will go ahead and take the amoxicillin that he was prescribed at urgent care recently.  INTERIM HX: Doing much better. Mood swings are gone.  Much less irritability. Some nights has insomnia but some nights goes to sleep easily.  He does use lorazepam  most nights, still occasional daytime use. He is interested in counseling.  Denies any side effects from lithium .  PMP AWARE reviewed today: most recent rx for lorazepam  was filled 02/17/2024, # 30, rx by me.  Most recent phentermine prescription was filled 02/29/2024, #60, prescription by Montie Pinal. No red flags.  Review of systems: No headaches, no tremors, no rash, no nausea, no diarrhea, no dizziness, no swelling in the legs, No abdominal pain.  Past Medical History:  Diagnosis Date   Allergic rhinitis    Dysplastic nevi    back,thigh,chest (07/2019)   Epididymitis 09/2019   ordered scrotal u/s to further eval ? nodule pt felt at the time but pt did not return radiology's calls to schedule.   Essential hypertension    Family history of colon cancer    Uncle with col cancer, and father with adenomatous polyps in his 65s.   Frequent headaches    secondary to chronic anxiety---CT scan brain normal  in the past per pt report.   GAD (generalized anxiety disorder)    GERD (gastroesophageal reflux disease)    Irritable bowel syndrome    Obesity, Class I, BMI 30-34.9    OSA (obstructive sleep apnea) 10/2021   mild OSA    Past Surgical History:  Procedure Laterality Date   CLAVICLE SURGERY Left    bone spurs removed   ESOPHAGOGASTRODUODENOSCOPY  05/13/2021   antral gastritis, o/w normal.   HAIR TRANSPLANT  09/2015   [facility]   labrum repair Left     Outpatient Medications Prior to Visit  Medication Sig Dispense Refill   albuterol  (VENTOLIN  HFA) 108 (90 Base) MCG/ACT inhaler Inhale 2 puffs into the lungs every 6 (six) hours as needed for wheezing or shortness of breath. 8 g 0   cetirizine (ZYRTEC) 10 MG tablet Take 10 mg by mouth daily.     famotidine  (PEPCID ) 20 MG tablet TAKE 1 TABLET BY MOUTH TWICE A DAY 60 tablet 11   pantoprazole  (PROTONIX ) 40 MG tablet TAKE 1 TABLET BY MOUTH TWICE A DAY 180 tablet 0   finasteride  (PROPECIA ) 1 MG tablet Take 1 tablet (1 mg total) by mouth daily. 90 tablet 1   lithium  300 MG tablet 1 tab po tid 90 tablet 0   LORazepam  (ATIVAN ) 1 MG tablet 1 tab po q8h prn anxiety 30 tablet 0   scopolamine  (TRANSDERM-SCOP) 1 MG/3DAYS APPLY 1 TIME BY TRANSDERMAL ROUTE  EVERY 3 DAYS (Patient not taking: Reported on 03/02/2024) 4 patch 1   predniSONE  (DELTASONE ) 20 MG tablet 2 tabs p.o. daily x 5 days then 1 tab p.o. daily x 5 days 15 tablet 0   No facility-administered medications prior to visit.    Allergies  Allergen Reactions   Duloxetine  Other (See Comments)    Shaky, paranoid     Lisinopril      Review of Systems As per HPI  PE:    03/02/2024    1:04 PM 02/17/2024    8:46 AM 01/26/2024   10:47 AM  Vitals with BMI  Height 5' 9 5' 9 5' 9  Weight 160 lbs 164 lbs 3 oz 156 lbs 6 oz  BMI 23.62 24.24 23.09  Systolic 127 129 868  Diastolic 63 71 75  Pulse 73 84 62     Physical Exam  Gen: Alert, well appearing.  Patient is oriented to person,  place, time, and situation. AFFECT: pleasant, lucid thought and speech.   LABS:  Last CBC Lab Results  Component Value Date   WBC 5.1 01/26/2024   HGB 15.4 01/26/2024   HCT 44.9 01/26/2024   MCV 87.8 01/26/2024   RDW 12.5 01/26/2024   PLT 240.0 01/26/2024   Last metabolic panel Lab Results  Component Value Date   GLUCOSE 90 01/26/2024   NA 141 01/26/2024   K 4.4 01/26/2024   CL 99 01/26/2024   CO2 33 (H) 01/26/2024   BUN 19 01/26/2024   CREATININE 1.01 01/26/2024   GFR 94.15 01/26/2024   CALCIUM 9.7 01/26/2024   PROT 7.3 01/26/2024   ALBUMIN 4.9 01/26/2024   BILITOT 0.9 01/26/2024   ALKPHOS 44 01/26/2024   AST 15 01/26/2024   ALT 19 01/26/2024   Last thyroid  functions Lab Results  Component Value Date   TSH 0.66 01/26/2024   02/17/24 lithium  level <0.3  IMPRESSION AND PLAN:  Bipolar affective disorder and generalized anxiety with insomnia. Doing much better.  Will continue lithium  300 mg 3 times a day and he will continue to use lorazepam  1 mg twice daily as needed.  He mostly uses this at night for sleep. If still taking this consistently when I see him for follow-up in 6 weeks we will do a controlled substance contract and we will get urine drug screen. Refer to behavioral health for counseling.  An After Visit Summary was printed and given to the patient.  FOLLOW UP: Return in about 6 weeks (around 04/13/2024) for f/u psych.  Signed:  Gerlene Hockey, MD           03/02/2024

## 2024-03-03 ENCOUNTER — Other Ambulatory Visit: Payer: Self-pay

## 2024-03-03 DIAGNOSIS — F3177 Bipolar disorder, in partial remission, most recent episode mixed: Secondary | ICD-10-CM

## 2024-04-13 ENCOUNTER — Encounter: Payer: Self-pay | Admitting: Family Medicine

## 2024-04-13 ENCOUNTER — Ambulatory Visit: Payer: Self-pay

## 2024-04-13 ENCOUNTER — Ambulatory Visit: Admitting: Family Medicine

## 2024-04-13 VITALS — BP 118/67 | HR 76 | Temp 97.6°F | Ht 69.0 in | Wt 165.2 lb

## 2024-04-13 DIAGNOSIS — R0981 Nasal congestion: Secondary | ICD-10-CM | POA: Diagnosis not present

## 2024-04-13 DIAGNOSIS — F3163 Bipolar disorder, current episode mixed, severe, without psychotic features: Secondary | ICD-10-CM | POA: Diagnosis not present

## 2024-04-13 DIAGNOSIS — J069 Acute upper respiratory infection, unspecified: Secondary | ICD-10-CM | POA: Diagnosis not present

## 2024-04-13 DIAGNOSIS — J029 Acute pharyngitis, unspecified: Secondary | ICD-10-CM | POA: Diagnosis not present

## 2024-04-13 DIAGNOSIS — Z79899 Other long term (current) drug therapy: Secondary | ICD-10-CM

## 2024-04-13 LAB — POCT RAPID STREP A (OFFICE): Rapid Strep A Screen: NEGATIVE

## 2024-04-13 LAB — POCT INFLUENZA A/B
Influenza A, POC: NEGATIVE
Influenza B, POC: NEGATIVE

## 2024-04-13 MED ORDER — RISPERIDONE 1 MG PO TABS: 1.0000 mg | ORAL_TABLET | Freq: Every day | ORAL | 0 refills | Status: DC

## 2024-04-13 NOTE — Telephone Encounter (Signed)
 Attempt #1 to triage pt symptoms. Left VM to call back   Message from [place] S sent at 04/13/2024  8:56 AM EST  Summary: Fever   Reason for Triage: fever of 101; would like to know if he should keep his appt scheduled for today at 2:40 pm

## 2024-04-13 NOTE — Telephone Encounter (Signed)
 Attempt #2 to reach patient to discuss symptoms and possibly changing to Virtual Visit. VM left to call back

## 2024-04-13 NOTE — Telephone Encounter (Signed)
 FYI Only or Action Required?: FYI only for provider:  .  Patient was last seen in primary care on 03/02/2024 by McGowen, Aleene DEL, MD.  Called Nurse Triage reporting Advice Only.   Triage Disposition: Information or Advice Only Call  Patient/caregiver understands and will follow disposition?: Yes      Reason for Disposition  General information question, no triage required and triager able to answer question  Answer Assessment - Initial Assessment Questions This RN spoke with pt and pt states he has not been feeling well lately. He states he has had a fever of 101. Advised that pt can keep appt for today and pt verbalized understanding.    1. REASON FOR CALL: What is the main reason for your call? or How can I best help you?     Pt requesting if he can still come to his appointment today 2. SYMPTOMS : Do you have any symptoms?      Fever  3. OTHER QUESTIONS: Do you have any other questions?     Denies  Protocols used: Information Only Call - No Triage-A-AH

## 2024-04-13 NOTE — Progress Notes (Signed)
 OFFICE VISIT  04/13/2024  CC:  Chief Complaint  Patient presents with   Nasal Congestion    Pt also c/o sore throat and fever; has been taking Sudafed and Tylenol    Patient is a 38 y.o. male who presents for 6-week follow-up bipolar disorder and GAD with insomnia. A/P as of last visit: Bipolar affective disorder and generalized anxiety with insomnia. Doing much better.  Will continue lithium  300 mg 3 times a day and he will continue to use lorazepam  1 mg twice daily as needed.  He mostly uses this at night for sleep. If still taking this consistently when I see him for follow-up in 6 weeks we will do a controlled substance contract and we will get urine drug screen. Refer to behavioral health for counseling.  INTERIM HX: About 36h fatigue, nasal cong, sore throat, temp to 101 yesterday,  No cough. His kids are sick with similar illness.  He notes worsened psych condition since I last saw him.  Racing thoughts, not getting much done, feeling overwhelmed and frazzled, irritable, mood rapidly cycling up and down.  Affecting work and family relationships.  He is spending a lot of money. No paranoia, hallucinations, or delusions. No alcohol or drug abuse. He is taking lithium  300 mg 3 times daily and lorazepam  1 mg nightly. Overall he feels like he is tolerating his medications very well.  He has some nausea with his midday dose of lithium  if he does not eat first.  He has a little bit of tremulousness that he notes in the morning.  PMP AWARE reviewed today: most recent rx for lorazepam  1 mg was filled 03/02/24, # 45, rx by me. No red flags.  Past Medical History:  Diagnosis Date   Allergic rhinitis    Dysplastic nevi    back,thigh,chest (07/2019)   Epididymitis 09/2019   ordered scrotal u/s to further eval ? nodule pt felt at the time but pt did not return radiology's calls to schedule.   Essential hypertension    Family history of colon cancer    Uncle with col cancer, and  father with adenomatous polyps in his 10s.   Frequent headaches    secondary to chronic anxiety---CT scan brain normal in the past per pt report.   GAD (generalized anxiety disorder)    GERD (gastroesophageal reflux disease)    Irritable bowel syndrome    Obesity, Class I, BMI 30-34.9    OSA (obstructive sleep apnea) 10/2021   mild OSA    Past Surgical History:  Procedure Laterality Date   CLAVICLE SURGERY Left    bone spurs removed   ESOPHAGOGASTRODUODENOSCOPY  05/13/2021   antral gastritis, o/w normal.   HAIR TRANSPLANT  09/2015   [facility]   labrum repair Left     Outpatient Medications Prior to Visit  Medication Sig Dispense Refill   albuterol  (VENTOLIN  HFA) 108 (90 Base) MCG/ACT inhaler Inhale 2 puffs into the lungs every 6 (six) hours as needed for wheezing or shortness of breath. 8 g 0   cetirizine (ZYRTEC) 10 MG tablet Take 10 mg by mouth daily.     famotidine  (PEPCID ) 20 MG tablet TAKE 1 TABLET BY MOUTH TWICE A DAY 60 tablet 11   finasteride  (PROPECIA ) 1 MG tablet Take 1 tablet (1 mg total) by mouth daily. 90 tablet 3   lithium  300 MG tablet 1 tab po tid 90 tablet 1   LORazepam  (ATIVAN ) 1 MG tablet 1 tab po q8h prn anxiety 45 tablet 0  pantoprazole  (PROTONIX ) 40 MG tablet TAKE 1 TABLET BY MOUTH TWICE A DAY 180 tablet 0   phentermine 37.5 MG capsule Take 37.5 mg by mouth every morning.     scopolamine  (TRANSDERM-SCOP) 1 MG/3DAYS APPLY 1 TIME BY TRANSDERMAL ROUTE EVERY 3 DAYS (Patient not taking: Reported on 04/13/2024) 4 patch 1   No facility-administered medications prior to visit.    Allergies  Allergen Reactions   Duloxetine  Other (See Comments)    Shaky, paranoid     Lisinopril      Review of Systems As per HPI  PE:    04/13/2024    2:44 PM 03/02/2024    1:04 PM 02/17/2024    8:46 AM  Vitals with BMI  Height 5' 9 5' 9 5' 9  Weight 165 lbs 3 oz 160 lbs 164 lbs 3 oz  BMI 24.38 23.62 24.24  Systolic 118 127 870  Diastolic 67 63 71  Pulse 76 73 84      Physical Exam  VS: noted--normal. Gen: alert, NAD, NONTOXIC APPEARING. HEENT: eyes without injection, drainage, or swelling.  Ears: EACs clear, TMs with normal light reflex and landmarks.  Nose: Clear rhinorrhea, with some dried, crusty exudate adherent to mildly injected mucosa.  No purulent d/c.  No paranasal sinus TTP.  No facial swelling.  Throat and mouth without focal lesion.  No pharyngial swelling, erythema, or exudate.   Neck: supple, no LAD.   LUNGS: CTA bilat, nonlabored resps.   CV: RRR, no m/r/g. EXT: no c/c/e SKIN: no rash   LABS:  Last CBC Lab Results  Component Value Date   WBC 5.1 01/26/2024   HGB 15.4 01/26/2024   HCT 44.9 01/26/2024   MCV 87.8 01/26/2024   RDW 12.5 01/26/2024   PLT 240.0 01/26/2024   Last metabolic panel Lab Results  Component Value Date   GLUCOSE 90 01/26/2024   NA 141 01/26/2024   K 4.4 01/26/2024   CL 99 01/26/2024   CO2 33 (H) 01/26/2024   BUN 19 01/26/2024   CREATININE 1.01 01/26/2024   GFR 94.15 01/26/2024   CALCIUM 9.7 01/26/2024   PROT 7.3 01/26/2024   ALBUMIN 4.9 01/26/2024   BILITOT 0.9 01/26/2024   ALKPHOS 44 01/26/2024   AST 15 01/26/2024   ALT 19 01/26/2024   Last thyroid  functions Lab Results  Component Value Date   TSH 0.66 01/26/2024   Lab Results  Component Value Date   LITHIUM  <0.3 (L) 02/17/2024   NA 141 01/26/2024   BUN 19 01/26/2024   CREATININE 1.01 01/26/2024   TSH 0.66 01/26/2024   WBC 5.1 01/26/2024   IMPRESSION AND PLAN:  #1 bipolar disorder, mixed episode/cycling. He has benefited from lithium  with minimal adverse side effects. However, need to add a second agent--> Risperdal 1 mg, increase by 1 mg a day and to a maximum of 5 mg daily dose.  I will see him back in 7 to 10 days. Continue lorazepam  1 mg nightly. Referred to behavioral health again.  #2 URI, suspect viral etiology. Rapid flu and strept today negative. Symptomatic care discussed.   An After Visit Summary was printed  and given to the patient.  FOLLOW UP: Return for 7-10 d f/u mood/anx.  Signed:  Gerlene Hockey, MD           04/13/2024

## 2024-04-13 NOTE — Telephone Encounter (Signed)
 FYI reviewed for appt tomorrow.

## 2024-04-21 ENCOUNTER — Ambulatory Visit: Admitting: Family Medicine

## 2024-05-07 ENCOUNTER — Other Ambulatory Visit: Payer: Self-pay | Admitting: Family Medicine

## 2024-05-08 ENCOUNTER — Encounter: Payer: Self-pay | Admitting: Family Medicine

## 2024-05-08 ENCOUNTER — Ambulatory Visit: Admitting: Family Medicine

## 2024-05-08 VITALS — BP 119/79 | HR 77 | Wt 163.8 lb

## 2024-05-08 DIAGNOSIS — F3178 Bipolar disorder, in full remission, most recent episode mixed: Secondary | ICD-10-CM

## 2024-05-08 MED ORDER — RISPERIDONE 1 MG PO TABS: 1.0000 mg | ORAL_TABLET | Freq: Every day | ORAL | 1 refills | Status: AC

## 2024-05-08 MED ORDER — LITHIUM CARBONATE 300 MG PO TABS: ORAL_TABLET | ORAL | 1 refills | Status: AC

## 2024-05-08 MED ORDER — LORAZEPAM 1 MG PO TABS: ORAL_TABLET | ORAL | 5 refills | Status: AC

## 2024-05-08 NOTE — Telephone Encounter (Signed)
Medication refilled today by PCP.

## 2024-05-08 NOTE — Telephone Encounter (Signed)
Pt has scheduled appt today

## 2024-05-08 NOTE — Progress Notes (Signed)
 OFFICE VISIT  05/08/2024  CC:  Chief Complaint  Patient presents with   Medical Management of Chronic Issues    Patient is a 38 y.o. male who presents for 3-week follow-up bipolar disorder. A/P as of last visit: 1 bipolar disorder, mixed episode/cycling. He has benefited from lithium  with minimal adverse side effects. However, need to add a second agent--> Risperdal  1 mg, increase by 1 mg a day and to a maximum of 5 mg daily dose.  I will see him back in 7 to 10 days. Continue lorazepam  1 mg nightly. Referred to behavioral health again.   #2 URI, suspect viral etiology. Rapid flu and strept today negative. Symptomatic care discussed.  INTERIM HX: Alec Downs is doing well. He had to decrease his lithium  to a twice a day 300 mg tab for a while because he was running out.  He ran out a week ago or so. He has been taking the Risperdal  1 mg dose. He denies any racing thoughts or significant feeling of hypomania/mania and he denies any significant depressed mood.  Less anxiety because not depressed anymore. Appetite is better. He is significantly tired during the day, takes his Risperdal  in the mornings. Not sleeping as well at night lately.  He feels like he can focus and participate in social interactions much better.  He is running doing great with his career. He has had some slight short-term memory problems in the last few weeks.  Things like walking into a room and not remembering what he went there for.  Sometimes little trouble thinking of the words he wants to say.   PMP AWARE reviewed today: most recent rx for phentermine 30 mg was filled 12/24/2023, # 30, rx by Delon Edouard.  Most recent lorazepam  prescription was filled 10-25, #45, prescription by me. No red flags.   Past Medical History:  Diagnosis Date   Allergic rhinitis    Dysplastic nevi    back,thigh,chest (07/2019)   Epididymitis 09/2019   ordered scrotal u/s to further eval ? nodule pt felt at the time but pt  did not return radiology's calls to schedule.   Essential hypertension    Family history of colon cancer    Uncle with col cancer, and father with adenomatous polyps in his 19s.   Frequent headaches    secondary to chronic anxiety---CT scan brain normal in the past per pt report.   GAD (generalized anxiety disorder)    GERD (gastroesophageal reflux disease)    Irritable bowel syndrome    Obesity, Class I, BMI 30-34.9    OSA (obstructive sleep apnea) 10/2021   mild OSA    Past Surgical History:  Procedure Laterality Date   CLAVICLE SURGERY Left    bone spurs removed   ESOPHAGOGASTRODUODENOSCOPY  05/13/2021   antral gastritis, o/w normal.   HAIR TRANSPLANT  09/2015   [facility]   labrum repair Left     Outpatient Medications Prior to Visit  Medication Sig Dispense Refill   albuterol  (VENTOLIN  HFA) 108 (90 Base) MCG/ACT inhaler Inhale 2 puffs into the lungs every 6 (six) hours as needed for wheezing or shortness of breath. 8 g 0   cetirizine (ZYRTEC) 10 MG tablet Take 10 mg by mouth daily.     famotidine  (PEPCID ) 20 MG tablet TAKE 1 TABLET BY MOUTH TWICE A DAY 60 tablet 11   finasteride  (PROPECIA ) 1 MG tablet Take 1 tablet (1 mg total) by mouth daily. 90 tablet 3   pantoprazole  (PROTONIX ) 40 MG tablet TAKE  1 TABLET BY MOUTH TWICE A DAY 180 tablet 0   scopolamine  (TRANSDERM-SCOP) 1 MG/3DAYS APPLY 1 TIME BY TRANSDERMAL ROUTE EVERY 3 DAYS 4 patch 1   lithium  300 MG tablet 1 tab po tid 90 tablet 1   LORazepam  (ATIVAN ) 1 MG tablet 1 tab po q8h prn anxiety 45 tablet 0   risperiDONE  (RISPERDAL ) 1 MG tablet Take 1 tablet (1 mg total) by mouth at bedtime. 30 tablet 0   phentermine 37.5 MG capsule Take 37.5 mg by mouth every morning. (Patient not taking: Reported on 05/08/2024)     No facility-administered medications prior to visit.    Allergies  Allergen Reactions   Duloxetine  Other (See Comments)    Shaky, paranoid     Lisinopril      Review of Systems As per HPI  PE:     05/08/2024   11:24 AM 04/13/2024    2:44 PM 03/02/2024    1:04 PM  Vitals with BMI  Height  5' 9 5' 9  Weight 163 lbs 13 oz 165 lbs 3 oz 160 lbs  BMI 24.18 24.38 23.62  Systolic 119 118 872  Diastolic 79 67 63  Pulse 77 76 73     Physical Exam  Gen: Alert, well appearing.  Patient is oriented to person, place, time, and situation. AFFECT: pleasant, lucid thought and speech. No further exam today  LABS:  Last CBC Lab Results  Component Value Date   WBC 5.1 01/26/2024   HGB 15.4 01/26/2024   HCT 44.9 01/26/2024   MCV 87.8 01/26/2024   RDW 12.5 01/26/2024   PLT 240.0 01/26/2024   Last metabolic panel Lab Results  Component Value Date   GLUCOSE 90 01/26/2024   NA 141 01/26/2024   K 4.4 01/26/2024   CL 99 01/26/2024   CO2 33 (H) 01/26/2024   BUN 19 01/26/2024   CREATININE 1.01 01/26/2024   GFR 94.15 01/26/2024   CALCIUM 9.7 01/26/2024   PROT 7.3 01/26/2024   ALBUMIN 4.9 01/26/2024   BILITOT 0.9 01/26/2024   ALKPHOS 44 01/26/2024   AST 15 01/26/2024   ALT 19 01/26/2024   Last thyroid  functions Lab Results  Component Value Date   TSH 0.66 01/26/2024   IMPRESSION AND PLAN:  Bipolar affective disorder. Doing well since getting on Risperdal .  Continue 1 mg dose but change to nighttime. I think he needs to get back on his lithim but will restart at the 300 twice daily dosing rather than 3 times daily. We initiated behavioral health referral again at last visit.  We will check into this to see if we need to do anything to push the process along.  An After Visit Summary was printed and given to the patient.  FOLLOW UP: Return in about 3 months (around 08/06/2024) for routine chronic illness f/u.  Signed:  Gerlene Hockey, MD           05/08/2024

## 2024-06-23 ENCOUNTER — Other Ambulatory Visit: Payer: Self-pay | Admitting: Family Medicine

## 2024-06-29 ENCOUNTER — Ambulatory Visit: Admitting: Family Medicine

## 2024-06-29 ENCOUNTER — Encounter: Payer: Self-pay | Admitting: Family Medicine

## 2024-06-29 VITALS — BP 108/60 | HR 74 | Temp 97.8°F | Ht 69.0 in | Wt 168.6 lb

## 2024-06-29 DIAGNOSIS — K219 Gastro-esophageal reflux disease without esophagitis: Secondary | ICD-10-CM | POA: Diagnosis not present

## 2024-06-29 DIAGNOSIS — F3160 Bipolar disorder, current episode mixed, unspecified: Secondary | ICD-10-CM

## 2024-06-29 MED ORDER — PANTOPRAZOLE SODIUM 40 MG PO TBEC: 40.0000 mg | DELAYED_RELEASE_TABLET | Freq: Two times a day (BID) | ORAL | 3 refills | Status: AC

## 2024-06-29 NOTE — Progress Notes (Signed)
 OFFICE VISIT  06/29/2024  CC:  Chief Complaint  Patient presents with   Medical Management of Chronic Issues    Patient is a 39 y.o. male who presents for follow-up bipolar disorder and GERD. I last saw him on 05/08/2024. A/P as of that visit: Bipolar affective disorder. Doing well since getting on Risperdal .  Continue 1 mg dose but change to nighttime.  I think he needs to get back on his lithim but will restart at the 300 twice daily dosing rather than 3 times daily. We initiated behavioral health referral again at last visit.  We will check into this to see if we need to do anything to push the process along.  INTERIM HX: Feeling well. Mood/anxiety well controlled. Pantop bid essential.  No side effects from meds.  ROS --> no fevers, no CP, no SOB, no wheezing, no cough, no dizziness, no HAs, no rashes, no melena/hematochezia.  No polyuria or polydipsia.  No myalgias or arthralgias.  No focal weakness, paresthesias, or tremors.  No acute vision or hearing abnormalities.  No dysuria or unusual/new urinary urgency or frequency.  No recent changes in lower legs. No n/v/d or abd pain.  No palpitations.     Past Medical History:  Diagnosis Date   Allergic rhinitis    Dysplastic nevi    back,thigh,chest (07/2019)   Epididymitis 09/2019   ordered scrotal u/s to further eval ? nodule pt felt at the time but pt did not return radiology's calls to schedule.   Essential hypertension    Family history of colon cancer    Uncle with col cancer, and father with adenomatous polyps in his 93s.   Frequent headaches    secondary to chronic anxiety---CT scan brain normal in the past per pt report.   GAD (generalized anxiety disorder)    GERD (gastroesophageal reflux disease)    Irritable bowel syndrome    Obesity, Class I, BMI 30-34.9    OSA (obstructive sleep apnea) 10/2021   mild OSA    Past Surgical History:  Procedure Laterality Date   CLAVICLE SURGERY Left    bone spurs  removed   ESOPHAGOGASTRODUODENOSCOPY  05/13/2021   antral gastritis, o/w normal.   HAIR TRANSPLANT  09/2015   [facility]   labrum repair Left     Outpatient Medications Prior to Visit  Medication Sig Dispense Refill   albuterol  (VENTOLIN  HFA) 108 (90 Base) MCG/ACT inhaler Inhale 2 puffs into the lungs every 6 (six) hours as needed for wheezing or shortness of breath. 8 g 0   cetirizine (ZYRTEC) 10 MG tablet Take 10 mg by mouth daily.     famotidine  (PEPCID ) 20 MG tablet TAKE 1 TABLET BY MOUTH TWICE A DAY 60 tablet 11   finasteride  (PROPECIA ) 1 MG tablet Take 1 tablet (1 mg total) by mouth daily. 90 tablet 3   lithium  300 MG tablet 1 tab po bid 180 tablet 1   LORazepam  (ATIVAN ) 1 MG tablet 1 tab po q8h prn anxiety 45 tablet 5   risperiDONE  (RISPERDAL ) 1 MG tablet Take 1 tablet (1 mg total) by mouth at bedtime. 90 tablet 1   scopolamine  (TRANSDERM-SCOP) 1 MG/3DAYS APPLY 1 TIME BY TRANSDERMAL ROUTE EVERY 3 DAYS 4 patch 1   pantoprazole  (PROTONIX ) 40 MG tablet TAKE 1 TABLET BY MOUTH TWICE A DAY 180 tablet 0   phentermine 37.5 MG capsule Take 37.5 mg by mouth every morning. (Patient not taking: Reported on 05/08/2024)     No facility-administered medications prior to  visit.    Allergies[1]  Review of Systems As per HPI  PE:    06/29/2024    2:53 PM 05/08/2024   11:24 AM 04/13/2024    2:44 PM  Vitals with BMI  Height 5' 9  5' 9  Weight 168 lbs 10 oz 163 lbs 13 oz 165 lbs 3 oz  BMI 24.89 24.18 24.38  Systolic 108 119 881  Diastolic 60 79 67  Pulse 74 77 76     Physical Exam  Gen: Alert, well appearing.  Patient is oriented to person, place, time, and situation. AFFECT: pleasant, lucid thought and speech.   LABS:  Last CBC Lab Results  Component Value Date   WBC 5.1 01/26/2024   HGB 15.4 01/26/2024   HCT 44.9 01/26/2024   MCV 87.8 01/26/2024   RDW 12.5 01/26/2024   PLT 240.0 01/26/2024   Last metabolic panel Lab Results  Component Value Date   GLUCOSE 90 01/26/2024    NA 141 01/26/2024   K 4.4 01/26/2024   CL 99 01/26/2024   CO2 33 (H) 01/26/2024   BUN 19 01/26/2024   CREATININE 1.01 01/26/2024   GFR 94.15 01/26/2024   CALCIUM 9.7 01/26/2024   PROT 7.3 01/26/2024   ALBUMIN 4.9 01/26/2024   BILITOT 0.9 01/26/2024   ALKPHOS 44 01/26/2024   AST 15 01/26/2024   ALT 19 01/26/2024   Last thyroid  functions Lab Results  Component Value Date   TSH 0.66 01/26/2024   Lab Results  Component Value Date   LITHIUM  <0.3 (L) 02/17/2024   NA 141 01/26/2024   BUN 19 01/26/2024   CREATININE 1.01 01/26/2024   TSH 0.66 01/26/2024   WBC 5.1 01/26/2024    IMPRESSION AND PLAN:  #1 GERD, doing well long term on pantoprazole  40mg  bid-->90d supply, RF x 3. At next f/u monitor b12 and iron.  #2 Bipolar disorder. Stable on lithium  300mg  bid and risperdal  1mg  at bedtime. Check cbc, cmet, and tsh at next f/u in 3 mo.  #3 GAD. Doing well with ativan  1mg  tid prn. New rx was not needed today.   An After Visit Summary was printed and given to the patient.  FOLLOW UP: Return in about 3 months (around 09/27/2024) for routine chronic illness f/u.  Signed:  Gerlene Hockey, MD           06/29/2024     [1]  Allergies Allergen Reactions   Duloxetine  Other (See Comments)    Shaky, paranoid     Lisinopril 

## 2024-07-19 ENCOUNTER — Ambulatory Visit (HOSPITAL_COMMUNITY): Payer: Self-pay | Admitting: Psychiatry

## 2024-08-07 ENCOUNTER — Ambulatory Visit: Admitting: Family Medicine
